# Patient Record
Sex: Female | Born: 1955 | Race: White | Hispanic: No | Marital: Single | Smoking: Never smoker
Health system: Southern US, Community
[De-identification: ages and names within clinical notes are randomized; demographics above are authoritative.]

## PROBLEM LIST (undated history)

## (undated) DIAGNOSIS — R519 Headache, unspecified: Secondary | ICD-10-CM

## (undated) DIAGNOSIS — M1990 Unspecified osteoarthritis, unspecified site: Secondary | ICD-10-CM

## (undated) DIAGNOSIS — D649 Anemia, unspecified: Secondary | ICD-10-CM

## (undated) DIAGNOSIS — R112 Nausea with vomiting, unspecified: Secondary | ICD-10-CM

## (undated) DIAGNOSIS — Z9889 Other specified postprocedural states: Secondary | ICD-10-CM

## (undated) DIAGNOSIS — G4730 Sleep apnea, unspecified: Secondary | ICD-10-CM

## (undated) DIAGNOSIS — F419 Anxiety disorder, unspecified: Secondary | ICD-10-CM

## (undated) DIAGNOSIS — J329 Chronic sinusitis, unspecified: Secondary | ICD-10-CM

## (undated) HISTORY — PX: SINUS EXPLORATION: SHX5214

## (undated) HISTORY — PX: BACK SURGERY: SHX140

## (undated) HISTORY — PX: OTHER SURGICAL HISTORY: SHX169

## (undated) HISTORY — PX: CHOLECYSTECTOMY: SHX55

## (undated) HISTORY — PX: SHOULDER ARTHROSCOPY: SHX128

---

## 1998-04-06 ENCOUNTER — Encounter: Admission: RE | Admit: 1998-04-06 | Discharge: 1998-07-05 | Payer: Self-pay | Admitting: Neurological Surgery

## 1998-07-23 ENCOUNTER — Emergency Department (HOSPITAL_COMMUNITY): Admission: EM | Admit: 1998-07-23 | Discharge: 1998-07-23 | Payer: Self-pay | Admitting: Emergency Medicine

## 1998-08-27 ENCOUNTER — Ambulatory Visit (HOSPITAL_COMMUNITY): Admission: RE | Admit: 1998-08-27 | Discharge: 1998-08-27 | Payer: Self-pay | Admitting: Neurological Surgery

## 1998-09-03 ENCOUNTER — Ambulatory Visit (HOSPITAL_COMMUNITY): Admission: RE | Admit: 1998-09-03 | Discharge: 1998-09-03 | Payer: Self-pay | Admitting: Neurological Surgery

## 1998-09-29 ENCOUNTER — Ambulatory Visit (HOSPITAL_COMMUNITY): Admission: RE | Admit: 1998-09-29 | Discharge: 1998-09-29 | Payer: Self-pay | Admitting: *Deleted

## 1998-09-29 ENCOUNTER — Encounter: Payer: Self-pay | Admitting: *Deleted

## 1998-10-24 ENCOUNTER — Other Ambulatory Visit: Admission: RE | Admit: 1998-10-24 | Discharge: 1998-10-24 | Payer: Self-pay | Admitting: Obstetrics and Gynecology

## 1998-10-31 ENCOUNTER — Ambulatory Visit (HOSPITAL_COMMUNITY): Admission: RE | Admit: 1998-10-31 | Discharge: 1998-10-31 | Payer: Self-pay | Admitting: Obstetrics and Gynecology

## 1999-02-01 ENCOUNTER — Encounter: Payer: Self-pay | Admitting: Neurological Surgery

## 1999-02-01 ENCOUNTER — Ambulatory Visit (HOSPITAL_COMMUNITY): Admission: RE | Admit: 1999-02-01 | Discharge: 1999-02-01 | Payer: Self-pay | Admitting: Neurological Surgery

## 1999-02-13 ENCOUNTER — Ambulatory Visit (HOSPITAL_COMMUNITY): Admission: RE | Admit: 1999-02-13 | Discharge: 1999-02-13 | Payer: Self-pay | Admitting: Neurological Surgery

## 1999-02-13 ENCOUNTER — Encounter: Payer: Self-pay | Admitting: Neurological Surgery

## 1999-12-28 ENCOUNTER — Other Ambulatory Visit: Admission: RE | Admit: 1999-12-28 | Discharge: 1999-12-28 | Payer: Self-pay | Admitting: Obstetrics and Gynecology

## 2000-01-10 ENCOUNTER — Ambulatory Visit (HOSPITAL_COMMUNITY): Admission: RE | Admit: 2000-01-10 | Discharge: 2000-01-10 | Payer: Self-pay | Admitting: Neurological Surgery

## 2000-01-10 ENCOUNTER — Encounter: Payer: Self-pay | Admitting: Neurological Surgery

## 2000-04-08 ENCOUNTER — Ambulatory Visit (HOSPITAL_COMMUNITY): Admission: RE | Admit: 2000-04-08 | Discharge: 2000-04-08 | Payer: Self-pay

## 2000-04-08 ENCOUNTER — Other Ambulatory Visit: Admission: RE | Admit: 2000-04-08 | Discharge: 2000-04-08 | Payer: Self-pay | Admitting: Obstetrics and Gynecology

## 2000-05-12 ENCOUNTER — Emergency Department (HOSPITAL_COMMUNITY): Admission: EM | Admit: 2000-05-12 | Discharge: 2000-05-12 | Payer: Self-pay | Admitting: *Deleted

## 2000-09-25 ENCOUNTER — Encounter: Payer: Self-pay | Admitting: *Deleted

## 2000-09-25 ENCOUNTER — Ambulatory Visit (HOSPITAL_COMMUNITY): Admission: RE | Admit: 2000-09-25 | Discharge: 2000-09-25 | Payer: Self-pay | Admitting: *Deleted

## 2000-12-04 ENCOUNTER — Encounter: Admission: RE | Admit: 2000-12-04 | Discharge: 2001-01-08 | Payer: Self-pay | Admitting: General Practice

## 2001-05-17 ENCOUNTER — Ambulatory Visit (HOSPITAL_COMMUNITY): Admission: EM | Admit: 2001-05-17 | Discharge: 2001-05-17 | Payer: Self-pay | Admitting: Emergency Medicine

## 2001-05-30 ENCOUNTER — Other Ambulatory Visit: Admission: RE | Admit: 2001-05-30 | Discharge: 2001-05-30 | Payer: Self-pay | Admitting: Obstetrics and Gynecology

## 2001-06-03 ENCOUNTER — Emergency Department (HOSPITAL_COMMUNITY): Admission: EM | Admit: 2001-06-03 | Discharge: 2001-06-03 | Payer: Self-pay | Admitting: Emergency Medicine

## 2001-07-21 ENCOUNTER — Ambulatory Visit (HOSPITAL_COMMUNITY): Admission: RE | Admit: 2001-07-21 | Discharge: 2001-07-21 | Payer: Self-pay | Admitting: Orthopaedic Surgery

## 2002-04-24 ENCOUNTER — Encounter: Admission: RE | Admit: 2002-04-24 | Discharge: 2002-06-22 | Payer: Self-pay | Admitting: General Practice

## 2002-07-09 ENCOUNTER — Other Ambulatory Visit: Admission: RE | Admit: 2002-07-09 | Discharge: 2002-07-09 | Payer: Self-pay | Admitting: Obstetrics and Gynecology

## 2002-11-06 ENCOUNTER — Ambulatory Visit (HOSPITAL_COMMUNITY): Admission: RE | Admit: 2002-11-06 | Discharge: 2002-11-06 | Payer: Self-pay | Admitting: Obstetrics and Gynecology

## 2002-11-06 ENCOUNTER — Encounter: Payer: Self-pay | Admitting: Obstetrics and Gynecology

## 2004-01-28 ENCOUNTER — Other Ambulatory Visit: Admission: RE | Admit: 2004-01-28 | Discharge: 2004-01-28 | Payer: Self-pay | Admitting: Obstetrics and Gynecology

## 2004-03-13 ENCOUNTER — Emergency Department (HOSPITAL_COMMUNITY): Admission: EM | Admit: 2004-03-13 | Discharge: 2004-03-14 | Payer: Self-pay | Admitting: Emergency Medicine

## 2004-04-18 ENCOUNTER — Ambulatory Visit (HOSPITAL_COMMUNITY): Admission: RE | Admit: 2004-04-18 | Discharge: 2004-04-18 | Payer: Self-pay | Admitting: Obstetrics and Gynecology

## 2006-04-17 ENCOUNTER — Encounter: Payer: Self-pay | Admitting: Neurological Surgery

## 2008-01-28 ENCOUNTER — Emergency Department (HOSPITAL_COMMUNITY): Admission: EM | Admit: 2008-01-28 | Discharge: 2008-01-28 | Payer: Self-pay | Admitting: Emergency Medicine

## 2008-05-10 ENCOUNTER — Emergency Department (HOSPITAL_COMMUNITY): Admission: EM | Admit: 2008-05-10 | Discharge: 2008-05-10 | Payer: Self-pay | Admitting: Emergency Medicine

## 2008-05-26 ENCOUNTER — Emergency Department (HOSPITAL_COMMUNITY): Admission: EM | Admit: 2008-05-26 | Discharge: 2008-05-26 | Payer: Self-pay | Admitting: Emergency Medicine

## 2008-06-04 ENCOUNTER — Emergency Department (HOSPITAL_COMMUNITY): Admission: EM | Admit: 2008-06-04 | Discharge: 2008-06-04 | Payer: Self-pay | Admitting: Emergency Medicine

## 2008-08-17 ENCOUNTER — Encounter: Admission: RE | Admit: 2008-08-17 | Discharge: 2008-08-17 | Payer: Self-pay | Admitting: Obstetrics and Gynecology

## 2009-04-01 ENCOUNTER — Emergency Department (HOSPITAL_COMMUNITY): Admission: EM | Admit: 2009-04-01 | Discharge: 2009-04-01 | Payer: Self-pay | Admitting: Emergency Medicine

## 2011-05-04 NOTE — Op Note (Signed)
[facility]  Patient:    Emily Brock, Emily Brock                         MRN: [id number] Proc. Date: 05/17/01 Attending:  Everardo All. Madilyn Fireman, M.D. CC:         Florencia Reasons, M.D.   Operative Report  PROCEDURE:  Esophagogastroduodenoscopy with foreign body removal.  ENDOSCOPIST:  Everardo All. Madilyn Fireman, M.D.  INDICATIONS FOR PROCEDURE:  Sensation of esophageal obstruction after eating a piece of rib eye steak.  DESCRIPTION OF PROCEDURE:   The patient was placed in the left lateral decubitus position and placed on the pulse monitor with continuous low-flow oxygen delivered by nasal cannula.  She was sedated with 100 mg IV Demerol and 10 mg IV Versed.  The Olympus video endoscope was advanced under direct vision into the oropharynx and esophagus.  The esophagus was straight and of normal caliber with a fair amount of saliva pooled in the esophagus.  The scope was advanced to the distal esophagus where there was a food bolus occupying the majority of the lumen.  With gentle pressure, it passed into the stomach where there was also some other solid food present.  Detailed examination of the stomach was not performed.  Retroflexed view of the cardia was unremarkable. The distal esophagus upon withdrawal into the body did appear somewhat inflamed, and there was some slight oozing of blood as if from a small tear. Therefore, I did not perform dilatation, and it was somewhat difficult to assess overall length of stricture versus ring due to the surrounding inflammation and blood.  The scope was then withdrawn, and the patient returned to the recovery room in stable condition.  She tolerated the procedure well, and there were no immediate complications.  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  PLAN: 1. Chew food carefully. 2. Trial of Prilosec. 3. Follow up with Dr. Matthias Hughs for elective dilatation. DD:  05/17/01 TD:   05/18/01 Job: [id number]

## 2011-05-04 NOTE — Op Note (Signed)
[place]  Patient:    Emily Brock, Emily Brock Visit Number: [id number] MRN: [id number]          Service Type: SUR Location: [facility] 2899 19 Attending Physician:  Jacki Cones Proc. Date: 08/08/01 Adm. Date:  07/21/2001 Disc. Date: 07/21/2001                             Operative Report  PREOPERATIVE DIAGNOSIS:  Right dorsal foot mass.  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  PROCEDURE:  Right foot exploration, dorsalis pedis artery mobilization and superficial perineal nerve neurolysis.  SURGEON:  Mark C. Ophelia Charter, M.D.  TOURNIQUET TIME:  41 minutes.  BRIEF HISTORY:  A 55 year old female with painful mass with shoe wearing and with activities.  MRI scan showed apparent ganglion of the dorsum of the foot adjacent to the neurovascular structures.  This was felt to most likely be a ganglion from either the ankle joint or talonavicular joint with superficial perineal nerve compression.  The patient was admitted for a planned ganglionic excision due to her persistent pain.  After the induction of preoperative anesthesia with tourniquet application, standard prepping, preoperative Ancef prophylaxis, usual draping and sterile glove placed over the toes, a skin marker was used, an incision was made longitudinally, directly over the mass.  Neurovascular bundle was identified after splitting the retinaculum and there was large aneurysmal dilatation of the dorsalis pedis artery which was tortuous.  The patient denied any previous history of trauma either recently or remotely.  Superficial perineal nerve was adherent to the tortuous mass as the branches came across extending toward the second and third toes.  Neurolysis was performed mobilizing the nerve, and the artery was taken laterally and branches of the nerve mobilized medially. There was no abnormalities to the  accompanying vena communicantes.  Exploration due to this showed no abnormalities of the ankle joint, no ganglion present.  Extensor tendons were normal.  Common extensors, EHL, were carefully inspected, pulled on, identified and there was no ganglion present.  It was apparent from visualization that the tortuous aorta was responsible for the findings on the MRI scan and after irrigation.  Subcutaneous tissue was reapproximated with Vicryl suture, and simple interrupted nylon suture. Postoperative soft dressing was applied, and tourniquet time was 41 minutes.  The patient was transferred to recovery room and then discharged home as an outpatient procedure. Attending Physician:  Jacki Cones DD:  08/08/01 TD:  08/10/01 Job: [id number]

## 2011-05-04 NOTE — Discharge Summary (Signed)
[place]  Patient:    Emily Brock, Emily Brock Visit Number: [id number] MRN: [id number]          Service Type: SUR Location: [place] 2899 19 Attending Physician:  Jacki Cones Dictated by:   Veverly Fells Ophelia Charter, M.D. Adm. Date:  07/21/2001 Disc. 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  PROCEDURE:  Right foot exploration, neurolysis of superficial peroneal nerve, and dorsalis pedis artery mobilization that was on July 21, 2001.  HISTORY OF PRESENT ILLNESS:  A 55 year old female who has had chronic pain in her foot for several years.  She has no history of trauma.  ADMISSION MEDICATIONS: 1. Ambien at night. 2. Valium 5 mg at night. 3. Hydrocodone p.r.n.  ALLERGIES: 1. TETRACYCLINE. 2. LATEX.  HOSPITAL COURSE:  The patient was admitted after informed consent.  She had had an MRI preoperatively, which had the appearance of a ganglion-type structure adjacent to the dorsalis pedis artery.  The mass was tender and gave her pain radiating into her toes.  Intraoperative exploration revealed that she had a dilated aneurysmal dilatation of the dorsalis pedis artery, which is most likely post-traumatic but the patient did not ever remember history of trauma, either acutely or remotely.  Superficial peroneal nerve had a neuroma in continuity, which was mobilized away from the artery.  Artery was mobilized as well.  Tourniquet time was 41 minutes and postoperatively the patient was ambulatory on crutches and was discharged. Dictated by:   Veverly Fells Ophelia Charter, M.D. Attending Physician:  Jacki Cones DD:  08/08/01 TD:  08/10/01 Job: [id number]

## 2011-11-12 ENCOUNTER — Encounter: Payer: Self-pay | Admitting: Emergency Medicine

## 2011-11-12 ENCOUNTER — Emergency Department (HOSPITAL_COMMUNITY)
Admission: EM | Admit: 2011-11-12 | Discharge: 2011-11-12 | Disposition: A | Discharge status: Home / Self Care | Payer: Medicaid - Out of State | Attending: Emergency Medicine | Admitting: Emergency Medicine

## 2011-11-12 DIAGNOSIS — Z79899 Other long term (current) drug therapy: Secondary | ICD-10-CM | POA: Insufficient documentation

## 2011-11-12 DIAGNOSIS — R51 Headache: Secondary | ICD-10-CM | POA: Insufficient documentation

## 2011-11-12 DIAGNOSIS — J329 Chronic sinusitis, unspecified: Secondary | ICD-10-CM

## 2011-11-12 DIAGNOSIS — J45909 Unspecified asthma, uncomplicated: Secondary | ICD-10-CM | POA: Insufficient documentation

## 2011-11-12 MED ORDER — AMOXICILLIN-POT CLAVULANATE 875-125 MG PO TABS: 1.0000 | ORAL_TABLET | Freq: Two times a day (BID) | ORAL | Status: AC

## 2011-11-12 NOTE — ED Notes (Signed)
Just moved back to [place] and had a h/a and now having sinus pain  Since last week

## 2011-11-12 NOTE — ED Provider Notes (Signed)
History     CSN: [id number] Arrival date & time: 11/12/2011  1:31 PM   First MD Initiated Contact with Patient 11/12/11 1505      Chief Complaint  Patient presents with  . Facial Pain     HPI  Onset - several days ago Course - worsening Associated symptoms - facial pain, mild headache, nasal congestion Worsened by - nothing Improved by - nothing  Pt reports she is having  sinus pain ,similar to prior sinus infections.  Report she always needs abx No visual changes   Past Medical History  Diagnosis Date  . Asthma     Past Surgical History  Procedure Date  . Back surgery   . Cholecystectomy   . Sinus exploration   . Shoulder arthroscopy     No family history on file.  History  Substance Use Topics  . Smoking status: Not on file  . Smokeless tobacco: Not on file  . Alcohol Use: No    OB History    Grav Para Term Preterm Abortions TAB SAB Ect Mult Living                  Review of Systems  Constitutional: Negative for fever.  Eyes: Negative for visual disturbance.    Allergies  Ciprofloxacin  Home Medications   Current Outpatient Rx  Name Route Sig Dispense Refill  . ALBUTEROL SULFATE HFA 108 (90 BASE) MCG/ACT IN AERS Inhalation Inhale 2 puffs into the lungs every 6 (six) hours as needed.      Marland Kitchen CALCIUM + D PO Oral Take 1 tablet by mouth daily.      Marland Kitchen FLUTICASONE PROPIONATE (INHAL) 100 MCG/BLIST IN AEPB Inhalation Inhale 1 puff into the lungs 2 (two) times daily.      Marland Kitchen HYDROCODONE-ACETAMINOPHEN 5-500 MG PO CAPS Oral Take 1 capsule by mouth 2 (two) times daily.      Marland Kitchen ZOLPIDEM TARTRATE 10 MG PO TABS Oral Take 10 mg by mouth at bedtime as needed.      . AMOXICILLIN-POT CLAVULANATE 875-125 MG PO TABS Oral Take 1 tablet by mouth every 12 (twelve) hours. 20 tablet 0    BP 118/77  Pulse 92  Temp(Src) 98 F (36.7 C) (Oral)  Resp 16  SpO2 100%  Physical Exam  CONSTITUTIONAL: Well developed/well nourished HEAD AND FACE:  Normocephalic/atraumatic EYES: EOMI/PERRL ENMT: Mucous membranes moist, nasal congestion, no drainage noted, no facial erythema/edema NECK: supple no meningeal signs CV: S1/S2 noted, no murmurs/rubs/gallops noted LUNGS: Lungs are clear to auscultation bilaterally, no apparent distress ABDOMEN: soft, nontender, no rebound or guarding NEURO: Pt is awake/alert, moves all extremitiesx4 EXTREMITIES: pulses normal, full ROM SKIN: warm, color normal PSYCH: no abnormalities of mood noted   ED Course  Procedures (including critical care time)  Labs Reviewed - No data to display No results found.   1. Sinusitis       MDM  Nursing notes reviewed and considered in documentation  Pt requesting abx.  At this point, I recommended nasal spray, OTC meds as likely viral.  She insists on abx.  I wrote for augmentin (she requested this) but I asked her to try OTC meds first.  She will f/u with ENT        Joya Gaskins, MD 11/12/11 1531

## 2011-12-18 HISTORY — PX: OTHER SURGICAL HISTORY: SHX169

## 2012-01-17 ENCOUNTER — Other Ambulatory Visit: Payer: Self-pay | Admitting: Internal Medicine

## 2012-01-17 DIAGNOSIS — Z1231 Encounter for screening mammogram for malignant neoplasm of breast: Secondary | ICD-10-CM

## 2012-01-29 ENCOUNTER — Ambulatory Visit
Admission: RE | Admit: 2012-01-29 | Discharge: 2012-01-29 | Disposition: A | Discharge status: Home / Self Care | Payer: Medicaid Other | Source: Ambulatory Visit | Attending: Internal Medicine | Admitting: Internal Medicine

## 2012-01-29 DIAGNOSIS — Z1231 Encounter for screening mammogram for malignant neoplasm of breast: Secondary | ICD-10-CM

## 2012-06-18 ENCOUNTER — Encounter (HOSPITAL_COMMUNITY): Payer: Self-pay | Admitting: Emergency Medicine

## 2012-06-18 ENCOUNTER — Emergency Department (HOSPITAL_COMMUNITY)
Admission: EM | Admit: 2012-06-18 | Discharge: 2012-06-18 | Disposition: A | Discharge status: Home / Self Care | Payer: Medicaid Other | Attending: Emergency Medicine | Admitting: Emergency Medicine

## 2012-06-18 DIAGNOSIS — Z79899 Other long term (current) drug therapy: Secondary | ICD-10-CM | POA: Insufficient documentation

## 2012-06-18 DIAGNOSIS — J329 Chronic sinusitis, unspecified: Secondary | ICD-10-CM

## 2012-06-18 DIAGNOSIS — J45909 Unspecified asthma, uncomplicated: Secondary | ICD-10-CM | POA: Insufficient documentation

## 2012-06-18 HISTORY — DX: Chronic sinusitis, unspecified: J32.9

## 2012-06-18 MED ORDER — AMOXICILLIN-POT CLAVULANATE 875-125 MG PO TABS: 1.0000 | ORAL_TABLET | Freq: Two times a day (BID) | ORAL | Status: AC

## 2012-06-18 NOTE — ED Notes (Signed)
Thick mucus, yellowish. Painful to flush nose.

## 2012-06-18 NOTE — ED Provider Notes (Signed)
Medical screening examination/treatment/procedure(s) were performed by non-physician practitioner and as supervising physician I was immediately available for consultation/collaboration.   Suzi Roots, MD 06/18/12 1539

## 2012-06-18 NOTE — ED Notes (Signed)
Family at bedside. 

## 2012-06-18 NOTE — ED Notes (Signed)
MD at bedside. 

## 2012-06-18 NOTE — ED Notes (Signed)
Pt complaints of fatigue and headache. "sick feeling all over". Condition is chronic for pt and she has sinus infections very often. Pt will have an appointment with Dr. Verne Spurr in [facility].  No n/v/d. Pt complaints of chills.

## 2012-06-18 NOTE — ED Provider Notes (Signed)
History     CSN: [id number]  Arrival date & time 06/18/12  1105   None     Chief Complaint  Patient presents with  . Recurrent Sinusitis    (Consider location/radiation/quality/duration/timing/severity/associated sxs/prior treatment) HPI  Onset - several days ago  Course - worsening  Associated symptoms - facial pain, mild headache, nasal congestion  Worsened by - nothing  Improved by - nothing  Pt reports she is having sinus pain ,similar to prior sinus infections. Report she always needs abx  No visual changes   Past Medical History  Diagnosis Date  . Asthma   . Recurrent sinus infections     Past Surgical History  Procedure Date  . Back surgery   . Cholecystectomy   . Sinus exploration   . Shoulder arthroscopy   . Thoracic scapular infusion 2013  . Right foot surgery     No family history on file.  History  Substance Use Topics  . Smoking status: Never Smoker   . Smokeless tobacco: Not on file  . Alcohol Use: No    OB History    Grav Para Term Preterm Abortions TAB SAB Ect Mult Living                  Review of Systems   HEENT: denies blurry vision or change in hearing PULMONARY: Denies difficulty breathing and SOB CARDIAC: denies chest pain or heart palpitations MUSCULOSKELETAL:  denies being unable to ambulate ABDOMEN AL: denies abdominal pain GU: denies loss of bowel or urinary control NEURO: denies numbness and tingling in extremities      Allergies  Ciprofloxacin  Home Medications   Current Outpatient Rx  Name Route Sig Dispense Refill  . ALBUTEROL SULFATE HFA 108 (90 BASE) MCG/ACT IN AERS Inhalation Inhale 2 puffs into the lungs every 6 (six) hours as needed.      . AMOXICILLIN-POT CLAVULANATE 875-125 MG PO TABS Oral Take 1 tablet by mouth every 12 (twelve) hours. 42 tablet 0  . CALCIUM + D PO Oral Take 1 tablet by mouth daily.      Marland Kitchen FLUTICASONE PROPIONATE (INHAL) 100 MCG/BLIST IN AEPB Inhalation Inhale 1 puff into the lungs 2  (two) times daily.      Marland Kitchen HYDROCODONE-ACETAMINOPHEN 5-500 MG PO CAPS Oral Take 1 capsule by mouth 2 (two) times daily.      Marland Kitchen ZOLPIDEM TARTRATE 10 MG PO TABS Oral Take 10 mg by mouth at bedtime as needed.        BP 105/74  Pulse 112  Temp 98 F (36.7 C) (Oral)  Resp 18  Ht 5\' 3"  (1.6 m)  Wt 120 lb (54.432 kg)  BMI 21.26 kg/m2  SpO2 100%  Physical Exam  Nursing note and vitals reviewed. Constitutional: She appears well-developed and well-nourished. No distress.  HENT:  Head: Normocephalic and atraumatic.  Nose: Mucosal edema, rhinorrhea and sinus tenderness present. No nose lacerations, nasal deformity, septal deviation or nasal septal hematoma. No epistaxis.  No foreign bodies. Right sinus exhibits frontal sinus tenderness. Left sinus exhibits frontal sinus tenderness.  Eyes: Pupils are equal, round, and reactive to light.  Neck: Normal range of motion. Neck supple.  Cardiovascular: Normal rate and regular rhythm.   Pulmonary/Chest: Effort normal.  Abdominal: Soft.  Neurological: She is alert.  Skin: Skin is warm and dry.    ED Course  Procedures (including critical care time)  Labs Reviewed - No data to display No results found.   1. Sinusitis  MDM   Pt requesting abx. At this point, I recommended nasal spray, OTC meds as likely viral. She insists on abx. I wrote for augmentin (she requested this) but I asked her to try OTC meds first. She will f/u with ENT in [facility]  Pt has been advised of the symptoms that warrant their return to the ED. Patient has voiced understanding and has agreed to follow-up with the PCP or specialist.         Dorthula Matas, PA 06/18/12 1137

## 2013-03-17 ENCOUNTER — Other Ambulatory Visit: Payer: Self-pay | Admitting: Internal Medicine

## 2013-03-17 ENCOUNTER — Other Ambulatory Visit: Payer: Self-pay

## 2013-03-17 DIAGNOSIS — Z1231 Encounter for screening mammogram for malignant neoplasm of breast: Secondary | ICD-10-CM

## 2013-04-01 ENCOUNTER — Ambulatory Visit
Admission: RE | Admit: 2013-04-01 | Discharge: 2013-04-01 | Disposition: A | Discharge status: Home / Self Care | Payer: Medicaid Other | Source: Ambulatory Visit

## 2013-04-01 DIAGNOSIS — Z1231 Encounter for screening mammogram for malignant neoplasm of breast: Secondary | ICD-10-CM

## 2013-04-08 ENCOUNTER — Ambulatory Visit: Payer: Medicaid Other

## 2013-09-21 ENCOUNTER — Other Ambulatory Visit: Payer: Self-pay | Admitting: *Deleted

## 2013-09-21 DIAGNOSIS — M25511 Pain in right shoulder: Secondary | ICD-10-CM

## 2013-09-25 ENCOUNTER — Ambulatory Visit
Admission: RE | Admit: 2013-09-25 | Discharge: 2013-09-25 | Disposition: A | Discharge status: Home / Self Care | Payer: Medicaid Other | Source: Ambulatory Visit | Attending: *Deleted | Admitting: *Deleted

## 2013-09-25 DIAGNOSIS — M25511 Pain in right shoulder: Secondary | ICD-10-CM

## 2013-12-01 ENCOUNTER — Ambulatory Visit: Payer: Medicaid Other | Attending: Orthopedic Surgery | Admitting: Physical Therapy

## 2013-12-01 DIAGNOSIS — M25519 Pain in unspecified shoulder: Secondary | ICD-10-CM | POA: Insufficient documentation

## 2013-12-01 DIAGNOSIS — M25619 Stiffness of unspecified shoulder, not elsewhere classified: Secondary | ICD-10-CM | POA: Insufficient documentation

## 2013-12-01 DIAGNOSIS — IMO0001 Reserved for inherently not codable concepts without codable children: Secondary | ICD-10-CM | POA: Insufficient documentation

## 2013-12-01 DIAGNOSIS — M7989 Other specified soft tissue disorders: Secondary | ICD-10-CM | POA: Insufficient documentation

## 2013-12-07 ENCOUNTER — Ambulatory Visit: Payer: Medicaid Other | Admitting: Physical Therapy

## 2013-12-14 ENCOUNTER — Ambulatory Visit: Payer: Medicaid Other | Admitting: Physical Therapy

## 2013-12-16 ENCOUNTER — Ambulatory Visit: Payer: Medicaid Other | Admitting: Physical Therapy

## 2014-01-05 ENCOUNTER — Ambulatory Visit: Payer: Medicaid Other | Admitting: Physical Therapy

## 2014-06-30 ENCOUNTER — Encounter (HOSPITAL_COMMUNITY): Payer: Self-pay | Admitting: Emergency Medicine

## 2014-06-30 ENCOUNTER — Emergency Department (HOSPITAL_COMMUNITY)
Admission: EM | Admit: 2014-06-30 | Discharge: 2014-06-30 | Disposition: A | Discharge status: Home / Self Care | Payer: Medicaid Other | Attending: Emergency Medicine | Admitting: Emergency Medicine

## 2014-06-30 DIAGNOSIS — J45909 Unspecified asthma, uncomplicated: Secondary | ICD-10-CM | POA: Insufficient documentation

## 2014-06-30 DIAGNOSIS — IMO0002 Reserved for concepts with insufficient information to code with codable children: Secondary | ICD-10-CM | POA: Insufficient documentation

## 2014-06-30 DIAGNOSIS — J320 Chronic maxillary sinusitis: Secondary | ICD-10-CM

## 2014-06-30 DIAGNOSIS — Z79899 Other long term (current) drug therapy: Secondary | ICD-10-CM | POA: Insufficient documentation

## 2014-06-30 DIAGNOSIS — J3489 Other specified disorders of nose and nasal sinuses: Secondary | ICD-10-CM | POA: Diagnosis present

## 2014-06-30 MED ORDER — AMOXICILLIN-POT CLAVULANATE 500-125 MG PO TABS: 1.0000 | ORAL_TABLET | Freq: Three times a day (TID) | ORAL | Status: DC

## 2014-06-30 NOTE — ED Notes (Signed)
Pt states Monday started not feeling well, states has a sinus infection, states PCP can't see her until end of week, Pt states having facial pressure d/t not draining.

## 2014-06-30 NOTE — Discharge Instructions (Signed)
Sinusitis Sinusitis is redness, soreness, and swelling (inflammation) of the paranasal sinuses. Paranasal sinuses are air pockets within the bones of your face (beneath the eyes, the middle of the forehead, or above the eyes). In healthy paranasal sinuses, mucus is able to drain out, and air is able to circulate through them by way of your nose. However, when your paranasal sinuses are inflamed, mucus and air can become trapped. This can allow bacteria and other germs to grow and cause infection. Sinusitis can develop quickly and last only a short time (acute) or continue over a long period (chronic). Sinusitis that lasts for more than 12 weeks is considered chronic.  CAUSES  Causes of sinusitis include:  Allergies.  Structural abnormalities, such as displacement of the cartilage that separates your nostrils (deviated septum), which can decrease the air flow through your nose and sinuses and affect sinus drainage.  Functional abnormalities, such as when the small hairs (cilia) that line your sinuses and help remove mucus do not work properly or are not present. SYMPTOMS  Symptoms of acute and chronic sinusitis are the same. The primary symptoms are pain and pressure around the affected sinuses. Other symptoms include:  Upper toothache.  Earache.  Headache.  Bad breath.  Decreased sense of smell and taste.  A cough, which worsens when you are lying flat.  Fatigue.  Fever.  Thick drainage from your nose, which often is green and may contain pus (purulent).  Swelling and warmth over the affected sinuses. DIAGNOSIS  Your caregiver will perform a physical exam. During the exam, your caregiver may:  Look in your nose for signs of abnormal growths in your nostrils (nasal polyps).  Tap over the affected sinus to check for signs of infection.  View the inside of your sinuses (endoscopy) with a special imaging device with a light attached (endoscope), which is inserted into your  sinuses. If your caregiver suspects that you have chronic sinusitis, one or more of the following tests may be recommended:  Allergy tests.  Nasal culture--A sample of mucus is taken from your nose and sent to a lab and screened for bacteria.  Nasal cytology--A sample of mucus is taken from your nose and examined by your caregiver to determine if your sinusitis is related to an allergy. TREATMENT  Most cases of acute sinusitis are related to a viral infection and will resolve on their own within 10 days. Sometimes medicines are prescribed to help relieve symptoms (pain medicine, decongestants, nasal steroid sprays, or saline sprays).  However, for sinusitis related to a bacterial infection, your caregiver will prescribe antibiotic medicines. These are medicines that will help kill the bacteria causing the infection.  Rarely, sinusitis is caused by a fungal infection. In theses cases, your caregiver will prescribe antifungal medicine. For some cases of chronic sinusitis, surgery is needed. Generally, these are cases in which sinusitis recurs more than 3 times per year, despite other treatments. HOME CARE INSTRUCTIONS   Drink plenty of water. Water helps thin the mucus so your sinuses can drain more easily.  Use a humidifier.  Inhale steam 3 to 4 times a day (for example, sit in the bathroom with the shower running).  Apply a warm, moist washcloth to your face 3 to 4 times a day, or as directed by your caregiver.  Use saline nasal sprays to help moisten and clean your sinuses.  Take over-the-counter or prescription medicines for pain, discomfort, or fever only as directed by your caregiver. SEEK IMMEDIATE MEDICAL CARE IF:    You have increasing pain or severe headaches.  You have nausea, vomiting, or drowsiness.  You have swelling around your face.  You have vision problems.  You have a stiff neck.  You have difficulty breathing. MAKE SURE YOU:   Understand these  instructions.  Will watch your condition.  Will get help right away if you are not doing well or get worse. Document Released: 12/03/2005 Document Revised: 02/25/2012 Document Reviewed: 12/18/2011 Exit[employer] Patient Information 2015 [employer]. This information is not intended to replace advice given to you by your health care provider. Make sure you discuss any questions you have with your health care provider.  

## 2014-06-30 NOTE — ED Provider Notes (Signed)
CSN: [id number]     Arrival date & time 06/30/14  1318 History  This chart was scribed for non-physician practitioner, Eben Burow, PA-C, working with Linwood Dibbles, MD by Charline Bills, ED Scribe. This patient was seen in room WTR7/WTR7 and the patient's care was started at 1:46 PM.   Chief Complaint  Patient presents with  . Recurrent Sinusitis   The history is provided by the patient. No language interpreter was used.   HPI Comments: Emily Brock is a 58 y.o. female who presents to the Emergency Department complaining of gradually worsening nasal congestion onset 2 days ago. Pt reports associated subjective fever, chills, sinus pressure. She reports yellow mucous that she describes as sticky. Pt denies cough, SOB, ear pain, sore throat. She reports recurrent sinus infections; last treated for similar symptoms in May. Pt was not able to be seen by her PCP for another 2 days or her ENT, Dr. Verne Spurr, for a few more weeks. Pt has tried Tylenol, Flonase and steam with mild relief.   Past Medical History  Diagnosis Date  . Asthma   . Recurrent sinus infections    Past Surgical History  Procedure Laterality Date  . Back surgery    . Cholecystectomy    . Sinus exploration    . Shoulder arthroscopy    . Thoracic scapular infusion  2013  . Right foot surgery     No family history on file. History  Substance Use Topics  . Smoking status: Never Smoker   . Smokeless tobacco: Not on file  . Alcohol Use: No   OB History   Grav Para Term Preterm Abortions TAB SAB Ect Mult Living                 Review of Systems  Constitutional: Positive for fever (subjective) and chills.  HENT: Positive for congestion and sinus pressure. Negative for ear pain and sore throat.   Respiratory: Negative for cough and shortness of breath.   All other systems reviewed and are negative.  Allergies  Ciprofloxacin  Home Medications   Prior to Admission medications   Medication Sig Start Date End  Date Taking? Authorizing Provider  albuterol (PROVENTIL HFA;VENTOLIN HFA) 108 (90 BASE) MCG/ACT inhaler Inhale 2 puffs into the lungs every 6 (six) hours as needed.      Historical Provider, MD  amoxicillin-clavulanate (AUGMENTIN) 500-125 MG per tablet Take 1 tablet (500 mg total) by mouth 3 (three) times daily. 06/30/14    A Forcucci, PA-C  Calcium Carbonate-Vitamin D (CALCIUM + D PO) Take 1 tablet by mouth daily.      Historical Provider, MD  Fluticasone Propionate, Inhal, (FLOVENT DISKUS) 100 MCG/BLIST AEPB Inhale 1 puff into the lungs 2 (two) times daily.      Historical Provider, MD  hydrocodone-acetaminophen (LORCET-HD) 5-500 MG per capsule Take 1 capsule by mouth 2 (two) times daily.      Historical Provider, MD  zolpidem (AMBIEN) 10 MG tablet Take 10 mg by mouth at bedtime as needed.      Historical Provider, MD   Triage Vitals: BP 120/69  Pulse 110  Temp(Src) 98.1 F (36.7 C) (Oral)  Resp 16  Ht 5\' 3"  (1.6 m)  Wt 116 lb (52.617 kg)  BMI 20.55 kg/m2  SpO2 100% Physical Exam  Nursing note and vitals reviewed. Constitutional: She is oriented to person, place, and time. She appears well-developed and well-nourished. No distress.  HENT:  Head: Normocephalic and atraumatic.  Right Ear: Hearing, tympanic  membrane, external ear and ear canal normal.  Left Ear: Hearing, tympanic membrane, external ear and ear canal normal.  Nose: Mucosal edema present. Right sinus exhibits maxillary sinus tenderness. Left sinus exhibits maxillary sinus tenderness.  Mouth/Throat: Oropharynx is clear and moist. No oropharyngeal exudate.  Eyes: Conjunctivae are normal. No scleral icterus.  Neck: Normal range of motion. Neck supple. No JVD present. No thyromegaly present.  Cardiovascular: Normal rate, regular rhythm, normal heart sounds and intact distal pulses.  Exam reveals no gallop and no friction rub.   No murmur heard. Pulmonary/Chest: Effort normal and breath sounds normal. No respiratory  distress. She has no wheezes. She has no rales. She exhibits no tenderness.  Lymphadenopathy:    She has no cervical adenopathy.  Neurological: She is alert and oriented to person, place, and time.  Skin: Skin is warm and dry. She is not diaphoretic.  Psychiatric: She has a normal mood and affect. Her behavior is normal. Judgment and thought content normal.    ED Course  Procedures (including critical care time) DIAGNOSTIC STUDIES: Oxygen Saturation is 100% on RA, normal by my interpretation.    COORDINATION OF CARE: 2:01 PM-Discussed treatment plan which includes antibiotics and return precautions with pt at bedside and pt agreed to plan.   Labs Review Labs Reviewed - No data to display  Imaging Review No results found.   EKG Interpretation None      MDM   Final diagnoses:  Chronic maxillary sinusitis   Suspect that this is likely a viral sinusitis at this time given afebrile status and also symptoms of just a couple days.  I have encouraged the patient to use nasal saline and to use tylenol or ibuprofen at this time.  Patient insists on have a prescription for augmentin a this time.  I have given augmentin prescription but have warned her that this will likely not help her symptoms.  She was encouraged to follow-up with her PCP or her ENT.  She states understanding   I personally performed the services described in this documentation, which was scribed in my presence. The recorded information has been reviewed and is accurate.    Eben Burowourtney A Forcucci, PA-C 06/30/14 2102

## 2014-07-01 NOTE — ED Provider Notes (Signed)
Medical screening examination/treatment/procedure(s) were performed by non-physician practitioner and as supervising physician I was immediately available for consultation/collaboration.    , MD 07/01/14 0706 

## 2015-03-31 ENCOUNTER — Other Ambulatory Visit: Payer: Self-pay | Admitting: Internal Medicine

## 2015-03-31 DIAGNOSIS — M810 Age-related osteoporosis without current pathological fracture: Secondary | ICD-10-CM

## 2015-03-31 DIAGNOSIS — E2839 Other primary ovarian failure: Secondary | ICD-10-CM

## 2015-04-04 ENCOUNTER — Other Ambulatory Visit: Payer: Self-pay

## 2015-04-04 DIAGNOSIS — Z1231 Encounter for screening mammogram for malignant neoplasm of breast: Secondary | ICD-10-CM

## 2015-04-14 ENCOUNTER — Ambulatory Visit: Payer: Medicaid Other

## 2015-04-14 ENCOUNTER — Other Ambulatory Visit: Payer: Medicaid Other

## 2015-07-22 ENCOUNTER — Ambulatory Visit
Admission: RE | Admit: 2015-07-22 | Discharge: 2015-07-22 | Disposition: A | Discharge status: Home / Self Care | Payer: Medicaid Other | Source: Ambulatory Visit | Attending: Internal Medicine | Admitting: Internal Medicine

## 2015-07-22 ENCOUNTER — Ambulatory Visit
Admission: RE | Admit: 2015-07-22 | Discharge: 2015-07-22 | Disposition: A | Discharge status: Home / Self Care | Payer: Medicaid Other | Source: Ambulatory Visit

## 2015-07-22 DIAGNOSIS — E2839 Other primary ovarian failure: Secondary | ICD-10-CM

## 2015-07-22 DIAGNOSIS — Z1231 Encounter for screening mammogram for malignant neoplasm of breast: Secondary | ICD-10-CM

## 2015-07-22 DIAGNOSIS — M810 Age-related osteoporosis without current pathological fracture: Secondary | ICD-10-CM

## 2015-08-24 ENCOUNTER — Ambulatory Visit (HOSPITAL_BASED_OUTPATIENT_CLINIC_OR_DEPARTMENT_OTHER): Payer: Medicaid Other | Attending: Otolaryngology | Admitting: Radiology

## 2015-08-24 VITALS — Ht 63.0 in | Wt 118.0 lb

## 2015-08-24 DIAGNOSIS — G4710 Hypersomnia, unspecified: Secondary | ICD-10-CM | POA: Diagnosis present

## 2015-08-24 DIAGNOSIS — R400 Somnolence: Secondary | ICD-10-CM

## 2015-08-24 DIAGNOSIS — G4733 Obstructive sleep apnea (adult) (pediatric): Secondary | ICD-10-CM | POA: Insufficient documentation

## 2015-08-24 DIAGNOSIS — R0683 Snoring: Secondary | ICD-10-CM | POA: Diagnosis not present

## 2015-09-03 ENCOUNTER — Encounter (HOSPITAL_BASED_OUTPATIENT_CLINIC_OR_DEPARTMENT_OTHER): Payer: Medicaid Other | Admitting: Internal Medicine

## 2015-09-03 DIAGNOSIS — R400 Somnolence: Secondary | ICD-10-CM

## 2015-09-03 NOTE — Progress Notes (Signed)
    Patient Name: Emily Brock, Emily Brock Date: 08/24/2015 Gender: Female D.O.B: January 16, 1956 Age (years): 58 Referring Provider: Christia Reading Height (inches): 63 Interpreting Physician: Jetty Duhamel MD, ABSM Weight (lbs): 118 RPSGT: [place] Sink BMI: 21 MRN: [id number] Neck Size: 14.00 CLINICAL INFORMATION Sleep Study Type:Unattended Home Sleep Test     Indication for sleep study: 780.54 Hypersomnia, Fatigue, Hypersomnia, Hypersomnia (780.54), Snoring, Witnessed Apneas   Epworth Sleepiness Score: 0/24  SLEEP STUDY TECHNIQUE A multi-channel overnight portable sleep study was performed. The channels recorded were: nasal airflow, thoracic respiratory movement, and oxygen saturation with a pulse oximetry. Snoring was also monitored.  MEDICATIONS Patient self administered medications include: Charted for review.  SLEEP ARCHITECTURE Patient was studied for 313.2 minutes. The sleep efficiency was 99.8 % and the patient was supine for 51.5%. The arousal index was 0.0 per hour.  RESPIRATORY PARAMETERS The overall AHI was 29.5 per hour, with a central apnea index of 4.2 per hour.  The oxygen nadir was 77% during sleep.  CARDIAC DATA Mean heart rate during sleep was 90.6 bpm.  IMPRESSIONS Moderate obstructive sleep apnea occurred during this study (AHI = 29.5/h). No significant central sleep apnea occurred during this study (CAI = 4.2/h). Severe oxygen desaturation was noted during this study (Min O2 = 77%). Mean O2 saturation during sleep on room air was 95% Patient snored 15.6% of time during  Sleep.  DIAGNOSIS Obstructive Sleep Apnea (327.23 [G47.33 ICD-10])  RECOMMENDATIONS Avoid alcohol, sedatives and other CNS depressants that may worsen sleep apnea and disrupt normal sleep architecture. Sleep hygiene should be reviewed to assess factors that may improve sleep quality. Weight management and regular exercise should be initiated or continued. Treatment options to be  reviewed with patient by managing physician.  Waymon Budge Diplomate, American Board of Sleep Medicine  ELECTRONICALLY SIGNED ON:  09/03/2015, 9:20 AM  [facility] PH: [phone]   FX: [phone] ACCREDITED BY THE AMERICAN ACADEMY OF SLEEP MEDICINE

## 2015-12-07 ENCOUNTER — Institutional Professional Consult (permissible substitution): Payer: Medicaid Other | Admitting: Internal Medicine

## 2015-12-20 ENCOUNTER — Ambulatory Visit (HOSPITAL_BASED_OUTPATIENT_CLINIC_OR_DEPARTMENT_OTHER): Payer: Medicaid Other | Attending: Otolaryngology | Admitting: Radiology

## 2015-12-20 VITALS — Ht 63.0 in | Wt 118.0 lb

## 2015-12-20 DIAGNOSIS — G4733 Obstructive sleep apnea (adult) (pediatric): Secondary | ICD-10-CM | POA: Diagnosis not present

## 2015-12-20 DIAGNOSIS — Z9989 Dependence on other enabling machines and devices: Secondary | ICD-10-CM

## 2015-12-20 DIAGNOSIS — Z79899 Other long term (current) drug therapy: Secondary | ICD-10-CM | POA: Diagnosis not present

## 2015-12-20 DIAGNOSIS — R0683 Snoring: Secondary | ICD-10-CM | POA: Insufficient documentation

## 2015-12-25 DIAGNOSIS — G4733 Obstructive sleep apnea (adult) (pediatric): Secondary | ICD-10-CM | POA: Diagnosis not present

## 2015-12-25 NOTE — Progress Notes (Signed)
Patient Name: Emily Brock, Blumenstein Date: 12/20/2015 Gender: Female D.O.B: 1956-01-18 Age (years): 59 Referring Provider: Melida Quitter Height (inches): 43 Interpreting Physician: Baird Lyons MD, ABSM Weight (lbs): 118 RPSGT: Laren Everts BMI: 21 MRN: [id number] Neck Size: 14.00 CLINICAL INFORMATION The patient is referred for a split night study with BPAP. Most recent polysomnogram dated 08/24/2015 revealed an AHI of 29.5/h and RDI of 29.5/h. MEDICATIONS Medications taken by the patient : AMBIEN, OXYCODONE HCL   SLEEP STUDY TECHNIQUE As per the AASM Manual for the Scoring of Sleep and Associated Events v2.3 (April 2016) with a hypopnea requiring 4% desaturations. The channels recorded and monitored were frontal, central and occipital EEG, electrooculogram (EOG), submentalis EMG (chin), nasal and oral airflow, thoracic and abdominal wall motion, anterior tibialis EMG, snore microphone, electrocardiogram, and pulse oximetry. Bi-level positive airway pressure (BiPAP) was initiated when the patient met split night criteria and was titrated according to treat sleep-disordered breathing.  RESPIRATORY PARAMETERS Diagnostic Total AHI (/hr): 31.2 RDI (/hr): 35.1 OA Index (/hr): 2.9 CA Index (/hr): 0.0 REM AHI (/hr): N/A NREM AHI (/hr): 31.2 Supine AHI (/hr): 31.2 Non-supine AHI (/hr): N/A Min O2 Sat (%): 80.00 Mean O2 (%): 91.48 Time below 88% (min): 9.6   Titration Optimal IPAP Pressure (cm): 19 Optimal EPAP Pressure (cm): 15 AHI at Optimal Pressure (/hr): 1.0 Min O2 at Optimal Pressure (%): 93.0 Sleep % at Optimal (%): 87 Supine % at Optimal (%): 2      SLEEP ARCHITECTURE The study was initiated at 10:17:54 PM and terminated at 4:30:06 AM. The total recorded time was 372.2 minutes. EEG confirmed total sleep time was 284.0 minutes yielding a sleep efficiency of 76.3%. Sleep onset after lights out was 23.5 minutes with a REM latency of 233.0 minutes. The patient spent 16.55% of the  night in stage N1 sleep, 72.54% in stage N2 sleep, 0.00% in stage N3 and 10.92% in REM. Wake after sleep onset (WASO) was 64.7 minutes. The Arousal Index was 20.5/hour.  LEG MOVEMENT DATA The total Periodic Limb Movements of Sleep (PLMS) were 2. The PLMS index was 0.42 .  CARDIAC DATA The 2 lead EKG demonstrated sinus rhythm. The mean heart rate was 98.93 beats per minute. Other EKG findings include: None.  IMPRESSIONS - Severe obstructive sleep apnea occurred during the diagnostic portion of the study (AHI = 31.2 /hour). An optimal BiPAP pressure was selected for this patient ( 19 / 15 cm of water) - CPAP was changed to BIPAP titration by tech because of uncomfortably high pressure requirement - No significant central sleep apnea occurred during the diagnostic portion of the study (CAI = 0.0/hour). - Mild oxygen desaturation was noted during the diagnostic portion of the study (Min O2 = 80.00%). - The patient snored with Moderate snoring volume during the diagnostic portion of the study. - No cardiac abnormalities were noted during this study. - Clinically significant periodic limb movements of sleep did not occur during the study.  DIAGNOSIS - Obstructive Sleep Apnea (327.23 [G47.33 ICD-10])  RECOMMENDATIONS - Trial of BiPAP therapy on 19/15 cm H2O with a Small size Resmed Full Face Mask AirFit F10 mask and heated humidification. - Avoid alcohol, sedatives and other CNS depressants that may worsen sleep apnea and disrupt normal sleep architecture. - Sleep hygiene should be reviewed to assess factors that may improve sleep quality. - Weight management and regular exercise should be initiated or continued.  [place], American Board of Sleep Medicine  ELECTRONICALLY SIGNED ON:  12/25/2015, 1:51  PM [place] [facility] PH: [phone]   FX: [phone] ACCREDITED BY THE AMERICAN ACADEMY OF SLEEP MEDICINE

## 2016-01-16 ENCOUNTER — Ambulatory Visit (INDEPENDENT_AMBULATORY_CARE_PROVIDER_SITE_OTHER): Payer: Medicaid Other | Admitting: Internal Medicine

## 2016-01-16 ENCOUNTER — Encounter: Payer: Self-pay | Admitting: Internal Medicine

## 2016-01-16 VITALS — BP 118/68 | HR 104 | Ht 63.0 in | Wt 123.6 lb

## 2016-01-16 DIAGNOSIS — G4733 Obstructive sleep apnea (adult) (pediatric): Secondary | ICD-10-CM | POA: Diagnosis not present

## 2016-01-16 NOTE — Patient Instructions (Addendum)
Order- DME Advanced    Autotitrate BIPAP Insp 5-20/   Exp  5-20                           Mask of choice, supplies, humidifier , AirView   Dx OSA  My staff will contact Dr Lamount Cranker office for the results of your initial Home Sleep Test done in the Fall, 2016

## 2016-01-16 NOTE — Progress Notes (Signed)
01/16/2016-60 year old female never smoker referred courtesy of Dr Jenne Pane; pt set up with BiPAP and not able to wear recently due to pressure or mask. DME is [facility].  Here with female partner [facility] Sleep Test 08/24/2015-AHI 29.5 per hour NPSG 12/20/15- severe OSA AHI 31.2/ hr, titration required BIPAP because of high pressure: 19/15 cwp, desat to 80% BIPAP 19/15 Advanced Download-VPAP auto 19/15 Advanced-inadequate compliance with residual AHI 10 per hour She reports snoring, witnessed apneas, unrefreshing nighttime sleep with daytime tiredness.. Sleep habits reviewed. She has been trying to use BiPAP but is bothered by leaks and high pressure. ENT surgery-sinus surgery in 2000. Previous neck/back surgeries.  Prior to Admission medications   Medication Sig Start Date End Date Taking? Authorizing Provider  albuterol (PROVENTIL HFA;VENTOLIN HFA) 108 (90 BASE) MCG/ACT inhaler Inhale 2 puffs into the lungs every 6 (six) hours as needed.     Yes Historical Provider, MD  Calcium Carbonate-Vitamin D (CALCIUM + D PO) Take 1 tablet by mouth daily.     Yes Historical Provider, MD  FLECTOR 1.3 % [facility]  12/15/15  Yes Historical Provider, MD  folic acid (FOLVITE) 1 MG tablet  12/21/15  Yes Historical Provider, MD  ibandronate (BONIVA) 150 MG tablet  01/12/16  Yes Historical Provider, MD  oxyCODONE (OXY IR/ROXICODONE) 5 MG immediate release tablet take 1 tablet by mouth UP TO 2 PER DAY FOR BREAKTHROUGH PAIN 01/12/16  Yes Historical Provider, MD  PULMICORT 0.5 MG/2ML nebulizer solution  12/15/15  Yes Historical Provider, MD  zolpidem (AMBIEN) 10 MG tablet Take 10 mg by mouth at bedtime as needed.     Yes Historical Provider, MD  Fluticasone Propionate, Inhal, (FLOVENT DISKUS) 100 MCG/BLIST AEPB Inhale 1 puff into the lungs 2 (two) times daily. Reported on 01/16/2016    Historical Provider, MD   Past Medical History  Diagnosis Date  . Asthma   . Recurrent sinus infections    Past Surgical History  Procedure  Laterality Date  . Back surgery    . Cholecystectomy    . Sinus exploration    . Shoulder arthroscopy    . Thoracic scapular infusion  2013  . Right foot surgery     No family history on file. Social History   Social History  . Marital Status: Single    Spouse Name: N/A  . Number of Children: N/A  . Years of Education: N/A   Occupational History  . Not on file.   Social History Main Topics  . Smoking status: Never Smoker   . Smokeless tobacco: Not on file  . Alcohol Use: No  . Drug Use: No  . Sexual Activity: Not on file   Other Topics Concern  . Not on file   Social History Narrative   ROS-see HPI   Negative unless "+" Constitutional:    weight loss, night sweats, fevers, chills, fatigue, lassitude. HEENT:    headaches, difficulty swallowing, tooth/dental problems, sore throat,       sneezing, itching, ear ache, nasal congestion, post nasal drip, snoring CV:    chest pain, orthopnea, PND, swelling in lower extremities, anasarca,                                                  dizziness, palpitations Resp:   shortness of breath with exertion or at rest.  productive cough,   non-productive cough, coughing up of blood.              change in color of mucus.  wheezing.   Skin:    rash or lesions. GI:  No-   heartburn, indigestion, abdominal pain, nausea, vomiting, diarrhea,                 change in bowel habits, loss of appetite GU: dysuria, change in color of urine, no urgency or frequency.   flank pain. MS:   joint pain, stiffness, decreased range of motion, back pain. Neuro-     nothing unusual Psych:  change in mood or affect.  depression or anxiety.   memory loss.  OBJ- Physical Exam General- Alert, Oriented, Affect-appropriate, Distress- none acute, medium build  Skin- rash-none, lesions- none, excoriation- none Lymphadenopathy- none Head- atraumatic            Eyes- Gross vision intact, PERRLA, conjunctivae and secretions clear             Ears- Hearing, canals-normal            Nose- Clear, no-Septal dev, mucus, polyps, erosion, perforation             Throat- Mallampati II-III , mucosa clear , drainage- none, tonsils- atrophic, own teeth Neck- flexible , trachea midline, no stridor , thyroid nl, carotid no bruit Chest - symmetrical excursion , unlabored           Heart/CV- RRR , no murmur , no gallop  , no rub, nl s1 s2                           - JVD- none , edema- none, stasis changes- none, varices- none           Lung- clear to P&A, wheeze- none, cough- none , dullness-none, rub- none           Chest wall-  Abd-  Br/ Gen/ Rectal- Not done, not indicated Extrem- cyanosis- none, clubbing, none, atrophy- none, strength- nl Neuro- grossly intact to observation

## 2016-01-18 ENCOUNTER — Telehealth: Payer: Self-pay | Admitting: Internal Medicine

## 2016-01-18 DIAGNOSIS — G4733 Obstructive sleep apnea (adult) (pediatric): Secondary | ICD-10-CM | POA: Insufficient documentation

## 2016-01-18 NOTE — Telephone Encounter (Signed)
New order placed. Called and left detailed message for Melissa letting her know order was placed and to call back if anything else is needed. Nothing further needed at this time.

## 2016-01-18 NOTE — Telephone Encounter (Signed)
Called and spoke to Emily Brock with [facility]. Melissa stated the pt's current Bipap order is incorrect because both IPAP and EPAP have a set range at 5-20, which it cannot. Melissa stated the order needs to have a "max IPAP" (ex: 20) and a "min EPAP" (ex: 5) and a "set pressure support" (ex: 4 - current pressure support pt is at).  Dr. Maple Hudson please advise. Thanks.

## 2016-01-18 NOTE — Telephone Encounter (Signed)
Ok change order to read as she suggests. Thanks

## 2016-01-18 NOTE — Assessment & Plan Note (Signed)
2 sleep studies demonstrate severe obstructive sleep apnea, consistent with her history. Examination wouldn't predict this much trouble, or need for such high Pap pressures. She is not overweight and pharynx anatomy is unremarkable. She is not tolerating the high pressure. Plan-auto titrate BiPAP for another look at pressure requirements

## 2016-01-19 ENCOUNTER — Telehealth: Payer: Self-pay | Admitting: Internal Medicine

## 2016-01-19 NOTE — Telephone Encounter (Signed)
Patient calling to check on the status of her BiPAP settings change.  Order was placed yesterday and confirmed receipt received from [facility] at [facility] today.  Advised patient that the order was placed yesterday and that [facility] has not had time to process the order yet. She requests that we contact [facility] and ask them if they can get the changes made before the weekend because she is very tired and she wants to be able to use her BiPAP this weekend to get some rest.  Message sent to [facility], awaiting response from [place].

## 2016-01-20 NOTE — Telephone Encounter (Signed)
Called Melissa and LMTCB x1 

## 2016-01-20 NOTE — Telephone Encounter (Signed)
Melissa Stenson  Tommie Sams, CMA; Karleen Hampshire, CMA           Emily Brock, I got your v/m message. This pt has been taken care of today and she is very happy.  Thanks

## 2016-01-25 ENCOUNTER — Encounter: Payer: Self-pay | Admitting: Internal Medicine

## 2016-02-20 ENCOUNTER — Encounter: Payer: Self-pay | Admitting: Internal Medicine

## 2016-02-20 ENCOUNTER — Other Ambulatory Visit (INDEPENDENT_AMBULATORY_CARE_PROVIDER_SITE_OTHER): Payer: Medicaid Other

## 2016-02-20 ENCOUNTER — Ambulatory Visit (INDEPENDENT_AMBULATORY_CARE_PROVIDER_SITE_OTHER): Payer: Medicaid Other | Admitting: Internal Medicine

## 2016-02-20 VITALS — BP 120/76 | HR 108 | Ht 63.0 in | Wt 125.8 lb

## 2016-02-20 DIAGNOSIS — E039 Hypothyroidism, unspecified: Secondary | ICD-10-CM

## 2016-02-20 DIAGNOSIS — G4733 Obstructive sleep apnea (adult) (pediatric): Secondary | ICD-10-CM | POA: Diagnosis not present

## 2016-02-20 LAB — CBC WITH DIFFERENTIAL/PLATELET
BASOS ABS: 0.1 10*3/uL (ref 0.0–0.1)
BASOS PCT: 0.8 % (ref 0.0–3.0)
EOS ABS: 0.1 10*3/uL (ref 0.0–0.7)
Eosinophils Relative: 1.5 % (ref 0.0–5.0)
HEMATOCRIT: 40.7 % (ref 36.0–46.0)
HEMOGLOBIN: 13.9 g/dL (ref 12.0–15.0)
LYMPHS PCT: 22 % (ref 12.0–46.0)
Lymphs Abs: 2.1 10*3/uL (ref 0.7–4.0)
MCHC: 34.2 g/dL (ref 30.0–36.0)
MCV: 85.8 fl (ref 78.0–100.0)
MONO ABS: 0.8 10*3/uL (ref 0.1–1.0)
Monocytes Relative: 8.5 % (ref 3.0–12.0)
Neutro Abs: 6.4 10*3/uL (ref 1.4–7.7)
Neutrophils Relative %: 67.2 % (ref 43.0–77.0)
Platelets: 416 10*3/uL — ABNORMAL HIGH (ref 150.0–400.0)
RBC: 4.75 Mil/uL (ref 3.87–5.11)
RDW: 12.9 % (ref 11.5–15.5)
WBC: 9.5 10*3/uL (ref 4.0–10.5)

## 2016-02-20 LAB — TSH: TSH: 0.44 u[IU]/mL (ref 0.35–4.50)

## 2016-02-20 MED ORDER — METHYLPHENIDATE HCL 5 MG PO TABS: 5.0000 mg | ORAL_TABLET | Freq: Two times a day (BID) | ORAL | Status: DC

## 2016-02-20 NOTE — Patient Instructions (Addendum)
Ok to keep trying your VPAP at current settings  Script printed for ritalin 5 mg,    1 twice daily as needed. Don't take later than 2:00 pm if it bothers your sleep.  Order- lab    TSH, CBC w diff  Dx hypothyroid

## 2016-02-20 NOTE — Progress Notes (Signed)
01/16/2016-60 year old female never smoker referred courtesy of Dr Jenne PaneBates; pt set up with BiPAP and not able to wear recently due to pressure or mask. DME is [facility].  [facility]-AHI 29.5 per hour NPSG 12/20/15- severe OSA AHI 31.2/ hr, titration required BIPAP because of high pressure: 19/15 cwp, desat to 80% BIPAP 19/15 Advanced Download-VPAP auto 19/15 Advanced-inadequate compliance with residual AHI 10 per hour She reports snoring, witnessed apneas, unrefreshing nighttime sleep with daytime tiredness.. Sleep habits reviewed. She has been trying to use BiPAP but is bothered by leaks and high pressure. ENT surgery-sinus surgery in 2000. Previous neck/back surgeries.  02/20/2016-60 year old female never smoker followed for OSA BIPAP auto 5-20/ Advanced FOLLOWS FOR:DME:[facility]. Pt states she is having trouble with her BiPAP settings-wakes up exhausted; DL is attached to OV notes. "Dragging". Husband says she sleeps better with her BiPAP machine. She has tried to be very compliant as documented by download, with adequate control, but feels she sleeps better without BiPAP. Primary complaint is a persistent sense of "not energized"-not really sleepy and does not take naps.  ROS-see HPI   Negative unless "+" Constitutional:    weight loss, night sweats, fevers, chills, fatigue, lassitude. HEENT:    headaches, difficulty swallowing, tooth/dental problems, sore throat,       sneezing, itching, ear ache, nasal congestion, post nasal drip, snoring CV:    chest pain, orthopnea, PND, swelling in lower extremities, anasarca,                                                  dizziness, palpitations Resp:   shortness of breath with exertion or at rest.                productive cough,   non-productive cough, coughing up of blood.              change in color of mucus.  wheezing.   Skin:    rash or lesions. GI:  No-   heartburn, indigestion, abdominal pain, nausea,  vomiting, diarrhea,                 change in bowel habits, loss of appetite GU: dysuria, change in color of urine, no urgency or frequency.   flank pain. MS:   joint pain, stiffness, decreased range of motion, back pain. Neuro-     nothing unusual Psych:  change in mood or affect.  depression or anxiety.   memory loss.  OBJ- Physical Exam General- Alert, Oriented, Affect-appropriate, Distress- none acute, medium build  Skin- rash-none, lesions- none, excoriation- none Lymphadenopathy- none Head- atraumatic            Eyes- Gross vision intact, PERRLA, conjunctivae and secretions clear            Ears- Hearing, canals-normal            Nose- Clear, no-Septal dev, mucus, polyps, erosion, perforation             Throat- Mallampati II-III , mucosa clear , drainage- none, tonsils- atrophic, own teeth Neck- flexible , trachea midline, no stridor , thyroid nl, carotid no bruit Chest - symmetrical excursion , unlabored           Heart/CV- RRR , no murmur , no gallop  , no rub, nl s1 s2                           -  JVD- none , edema- none, stasis changes- none, varices- none           Lung- clear to P&A, wheeze- none, cough- none , dullness-none, rub- none           Chest wall-  Abd-  Br/ Gen/ Rectal- Not done, not indicated Extrem- cyanosis- none, clubbing, none, atrophy- none, strength- nl Neuro- grossly intact to observation

## 2016-02-21 NOTE — Assessment & Plan Note (Addendum)
Obstructive sleep apnea component should be adequately covered based on her download. I suspect the residual "not energized" sensation is not sleepiness. Eventually she may want to try a nonsedating antidepressant. She did agree to try low-dose Ritalin after some discussion. She will really wasn't looking for stimulant medication. Eventually also may want to try repeat sleep study followed by [facility]. Plan-continue VPAP at present settings. TSH to exclude hypothyroid, CBC to exclude anemia

## 2016-02-28 ENCOUNTER — Telehealth: Payer: Self-pay | Admitting: Internal Medicine

## 2016-02-28 ENCOUNTER — Encounter: Payer: Self-pay | Admitting: Internal Medicine

## 2016-02-28 DIAGNOSIS — G4733 Obstructive sleep apnea (adult) (pediatric): Secondary | ICD-10-CM

## 2016-02-28 NOTE — Telephone Encounter (Signed)
Per 02/20/16 OV: Patient Instructions       Ok to keep trying your VPAP at current settings Script printed for ritalin 5 mg,    1 twice daily as needed. Don't take later than 2:00 pm if it bothers your sleep. Order- lab    TSH, CBC w diff  Dx hypothyroid  ---  ATC pt at # provided, received message the # you have dialed is incorrect.  Called home #--LMTCB x1

## 2016-02-29 NOTE — Telephone Encounter (Signed)
Spoke with pt, aware of recs.  Order placed to dme to switch back to cpap.  Nothing further needed.

## 2016-02-29 NOTE — Telephone Encounter (Signed)
Please ask her DME to change PAP order to CPAP auto 5-20  For dx OSA

## 2016-02-29 NOTE — Telephone Encounter (Signed)
Called spoke with pt. She reports she has not used her BIPAP in the past 3 nights. She slept 8 hrs last night w/o it. She reports she is still unable to tolerate the BIPAP. She is not sure what else to do. She reports she is really trying to use it Please advise Dr. Maple HudsonYoung thanks

## 2016-03-05 ENCOUNTER — Telehealth: Payer: Self-pay | Admitting: Internal Medicine

## 2016-03-05 DIAGNOSIS — G4733 Obstructive sleep apnea (adult) (pediatric): Secondary | ICD-10-CM

## 2016-03-05 NOTE — Telephone Encounter (Signed)
LMTCB

## 2016-03-06 NOTE — Telephone Encounter (Signed)
Pt returning call and can be reached @ same.Stanley A Dalton ° °

## 2016-03-06 NOTE — Telephone Encounter (Signed)
Spoke with pt, advised her of below recs.  States this is unacceptable.  Pt [facility] and see what else we can do, because she feels like she cannot go another month on this machine.    Will await call back from pt.

## 2016-03-06 NOTE — Telephone Encounter (Signed)
Spoke with the pt  She states [facility] needing further information to get her back on CPAP   [facility]  She states that the issue is that the pt tried and failed CPAP, then started on BIPAP, now wants CPAP again and medicaid will not pay for another CPAP right now  She states that her respiratory manager suggests that she keep the BIPAP and have EPAP at a min of 5, IPAP max 20 and pressure support of 0  If she tolerates this for a month, they may say it's ok to go back to CPAP

## 2016-03-06 NOTE — Telephone Encounter (Signed)
Pt returning call.Emily Brock ° °

## 2016-03-06 NOTE — Telephone Encounter (Signed)
LMTCB

## 2016-03-06 NOTE — Telephone Encounter (Signed)
Ok to order trial of her machine set at the settings suggested by DME. Thanks. Please let her know we are trying.

## 2016-03-09 NOTE — Telephone Encounter (Signed)
Spoke with the pt  She states [facility] she feels more comfortable with changing BIPAP settings as suggested  [facility]

## 2016-05-15 ENCOUNTER — Ambulatory Visit: Payer: Medicaid Other | Admitting: Internal Medicine

## 2016-11-27 ENCOUNTER — Other Ambulatory Visit: Payer: Self-pay | Admitting: Internal Medicine

## 2016-11-27 DIAGNOSIS — M810 Age-related osteoporosis without current pathological fracture: Secondary | ICD-10-CM

## 2016-11-27 DIAGNOSIS — Z1231 Encounter for screening mammogram for malignant neoplasm of breast: Secondary | ICD-10-CM

## 2016-12-24 ENCOUNTER — Ambulatory Visit
Admission: RE | Admit: 2016-12-24 | Discharge: 2016-12-24 | Disposition: A | Discharge status: Home / Self Care | Payer: Medicaid Other | Source: Ambulatory Visit | Attending: Internal Medicine | Admitting: Internal Medicine

## 2016-12-24 DIAGNOSIS — Z1231 Encounter for screening mammogram for malignant neoplasm of breast: Secondary | ICD-10-CM

## 2017-06-04 ENCOUNTER — Other Ambulatory Visit: Payer: Self-pay | Admitting: Internal Medicine

## 2017-06-04 DIAGNOSIS — M810 Age-related osteoporosis without current pathological fracture: Secondary | ICD-10-CM

## 2017-07-29 ENCOUNTER — Other Ambulatory Visit: Payer: Medicaid Other

## 2017-08-06 ENCOUNTER — Ambulatory Visit
Admission: RE | Admit: 2017-08-06 | Discharge: 2017-08-06 | Disposition: A | Discharge status: Home / Self Care | Payer: Medicaid Other | Source: Ambulatory Visit | Attending: Internal Medicine | Admitting: Internal Medicine

## 2017-08-06 DIAGNOSIS — M810 Age-related osteoporosis without current pathological fracture: Secondary | ICD-10-CM

## 2017-11-20 ENCOUNTER — Other Ambulatory Visit: Payer: Self-pay | Admitting: Internal Medicine

## 2017-11-20 DIAGNOSIS — Z1231 Encounter for screening mammogram for malignant neoplasm of breast: Secondary | ICD-10-CM

## 2017-12-20 ENCOUNTER — Ambulatory Visit: Payer: Self-pay

## 2018-01-21 ENCOUNTER — Other Ambulatory Visit: Payer: Self-pay | Admitting: Internal Medicine

## 2018-01-21 ENCOUNTER — Ambulatory Visit
Admission: RE | Admit: 2018-01-21 | Discharge: 2018-01-21 | Disposition: A | Discharge status: Home / Self Care | Payer: Medicaid Other | Source: Ambulatory Visit | Attending: Internal Medicine | Admitting: Internal Medicine

## 2018-01-21 DIAGNOSIS — Z1231 Encounter for screening mammogram for malignant neoplasm of breast: Secondary | ICD-10-CM

## 2018-05-08 ENCOUNTER — Other Ambulatory Visit: Payer: Self-pay | Admitting: Physical Medicine & Rehabilitation

## 2018-05-08 DIAGNOSIS — M7581 Other shoulder lesions, right shoulder: Secondary | ICD-10-CM

## 2018-05-14 ENCOUNTER — Ambulatory Visit
Admission: RE | Admit: 2018-05-14 | Discharge: 2018-05-14 | Disposition: A | Discharge status: Home / Self Care | Payer: Medicaid Other | Source: Ambulatory Visit | Attending: Physical Medicine & Rehabilitation | Admitting: Physical Medicine & Rehabilitation

## 2018-05-14 DIAGNOSIS — M7581 Other shoulder lesions, right shoulder: Secondary | ICD-10-CM

## 2019-07-10 ENCOUNTER — Other Ambulatory Visit: Payer: Self-pay | Admitting: Internal Medicine

## 2019-07-10 DIAGNOSIS — E2839 Other primary ovarian failure: Secondary | ICD-10-CM

## 2019-07-10 DIAGNOSIS — Z1231 Encounter for screening mammogram for malignant neoplasm of breast: Secondary | ICD-10-CM

## 2019-10-06 ENCOUNTER — Ambulatory Visit: Payer: Medicaid Other

## 2019-10-06 ENCOUNTER — Other Ambulatory Visit: Payer: Self-pay

## 2019-10-06 ENCOUNTER — Ambulatory Visit
Admission: RE | Admit: 2019-10-06 | Discharge: 2019-10-06 | Disposition: A | Discharge status: Home / Self Care | Payer: Medicaid Other | Source: Ambulatory Visit | Attending: Internal Medicine | Admitting: Internal Medicine

## 2019-10-06 DIAGNOSIS — Z1231 Encounter for screening mammogram for malignant neoplasm of breast: Secondary | ICD-10-CM

## 2019-10-06 DIAGNOSIS — E2839 Other primary ovarian failure: Secondary | ICD-10-CM

## 2021-01-24 ENCOUNTER — Other Ambulatory Visit: Payer: Self-pay | Admitting: Internal Medicine

## 2021-01-25 LAB — LIPID PANEL
Cholesterol: 189 mg/dL (ref <200)
HDL: 58 mg/dL (ref >=50)
LDL Cholesterol (Calc): 109 mg/dL (calc) — ABNORMAL HIGH
Non-HDL Cholesterol (Calc): 131 mg/dL (calc) — ABNORMAL HIGH (ref <130)
Total CHOL/HDL Ratio: 3.3 (calc) (ref <5.0)
Triglycerides: 116 mg/dL (ref <150)

## 2021-01-25 LAB — CBC
HCT: 43.2 % (ref 35.0–45.0)
Hemoglobin: 14.4 g/dL (ref 11.7–15.5)
MCH: 29.8 pg (ref 27.0–33.0)
MCHC: 33.3 g/dL (ref 32.0–36.0)
MCV: 89.3 fL (ref 80.0–100.0)
MPV: 9.2 fL (ref 7.5–12.5)
Platelets: 367 10*3/uL (ref 140–400)
RBC: 4.84 10*6/uL (ref 3.80–5.10)
RDW: 11.6 % (ref 11.0–15.0)
WBC: 7.8 10*3/uL (ref 3.8–10.8)

## 2021-01-25 LAB — VITAMIN D 25 HYDROXY (VIT D DEFICIENCY, FRACTURES): Vit D, 25-Hydroxy: 42 ng/mL (ref 30–100)

## 2021-01-25 LAB — COMPLETE METABOLIC PANEL WITH GFR
AG Ratio: 1.6 (calc) (ref 1.0–2.5)
ALT: 11 U/L (ref 6–29)
AST: 13 U/L (ref 10–35)
Albumin: 4.5 g/dL (ref 3.6–5.1)
Alkaline phosphatase (APISO): 40 U/L (ref 37–153)
BUN: 16 mg/dL (ref 7–25)
CO2: 26 mmol/L (ref 20–32)
Calcium: 9.7 mg/dL (ref 8.6–10.4)
Chloride: 101 mmol/L (ref 98–110)
Creat: 0.82 mg/dL (ref 0.50–0.99)
GFR, Est African American: 87 mL/min/{1.73_m2} (ref >=60)
GFR, Est Non African American: 75 mL/min/{1.73_m2} (ref >=60)
Globulin: 2.8 g/dL (calc) (ref 1.9–3.7)
Glucose, Bld: 94 mg/dL (ref 65–99)
Potassium: 4.6 mmol/L (ref 3.5–5.3)
Sodium: 137 mmol/L (ref 135–146)
Total Bilirubin: 0.5 mg/dL (ref 0.2–1.2)
Total Protein: 7.3 g/dL (ref 6.1–8.1)

## 2021-01-25 LAB — TSH: TSH: 0.63 mIU/L (ref 0.40–4.50)

## 2021-02-03 ENCOUNTER — Other Ambulatory Visit: Payer: Self-pay

## 2021-02-03 ENCOUNTER — Encounter: Payer: Self-pay | Admitting: Physical Therapy

## 2021-02-03 ENCOUNTER — Ambulatory Visit: Payer: Medicare Other | Attending: Orthopedic Surgery | Admitting: Physical Therapy

## 2021-02-03 DIAGNOSIS — M25511 Pain in right shoulder: Secondary | ICD-10-CM | POA: Diagnosis present

## 2021-02-03 DIAGNOSIS — M25611 Stiffness of right shoulder, not elsewhere classified: Secondary | ICD-10-CM | POA: Diagnosis present

## 2021-02-05 ENCOUNTER — Encounter: Payer: Self-pay | Admitting: Physical Therapy

## 2021-02-05 NOTE — Patient Instructions (Signed)
Access Code: [email or web address]: [email or web address]: 02/20/2022Prepared by: Onalee Hua CarrollExercises  Supine Shoulder External Rotation in 45 Degrees Abduction AAROM with Dowel - 2 x daily - 7 x weekly - 1 sets - 5 reps - 10sec hold  Circular Shoulder Pendulum with Table Support - 3 x daily - 7 x weekly - 1-2 minutes hold

## 2021-02-05 NOTE — Therapy (Signed)
[facility] [address] [place], [place], [address] Phone: [phone]   Fax:  [phone]  Physical Therapy Evaluation  Patient Details  Name: MARCH JOOS MRN: [id number] Date of Birth: 1956/06/28 Referring Provider (PT): Dr Vale Haven   Encounter Date: 02/03/2021   PT End of Session - 02/05/21 1344    Visit Number 1    Number of Visits 12    Date for PT Re-Evaluation 03/17/21    Authorization Type MCR/ MCD    PT Start Time 1315    PT Stop Time 1400    PT Time Calculation (min) 45 min    Activity Tolerance Patient tolerated treatment well    Behavior During Therapy [facility] for tasks assessed/performed           Past Medical History:  Diagnosis Date  . Asthma   . Recurrent sinus infections     Past Surgical History:  Procedure Laterality Date  . BACK SURGERY    . CHOLECYSTECTOMY    . right foot surgery    . SHOULDER ARTHROSCOPY    . SINUS EXPLORATION    . thoracic scapular infusion  2013    There were no vitals filed for this visit.    Subjective Assessment - 02/05/21 1325    Subjective Patient had a lysis of adhesions in her right shoulder on 2/10 202. She has an extensiove hisotry of shoulder history including a scapulo thoracic fusion x2. She has had 6 total surgeries on her shoulder. Her pain is currently well contirled.    Pertinent History Multi shoulder surgery    Limitations Lifting;Writing    How long can you sit comfortably? N/A    How long can you stand comfortably? N/A    How long can you walk comfortably? N/A    Diagnostic tests nothing post op    Patient Stated Goals to be able to use her arm with less pain    Currently in Pain? Yes   with movement   Pain Score 7     Pain Location Shoulder    Pain Orientation Right    Pain Descriptors / Indicators Aching    Pain Type Chronic pain;Surgical pain    Pain Onset More than a month ago    Pain Frequency Intermittent    Aggravating Factors  use  of her shoulder    Pain Relieving Factors rest    Effect of Pain on Daily Activities difficulty perfroming ADL's              [facility] PT Assessment - 02/05/21 0001      Assessment   Medical Diagnosis R Shoulder Lysis of Adhesions    Referring Provider (PT) Dr Vale Haven    Onset Date/Surgical Date 01/26/21    Hand Dominance Right    Next MD Visit Monday    Prior Therapy For prior shoulder surgeries      Precautions   Precautions None      Restrictions   Weight Bearing Restrictions No      Balance Screen   Has the patient fallen in the past 6 months Yes    How many times? 1    Has the patient had a decrease in activity level because of a fear of falling?  No    Is the patient reluctant to leave their home because of a fear of falling?  No      Home Environment   Additional Comments nothing significant  Prior Function   Level of Independence Independent    Vocation Retired    Leisure home renovations, gardening and cooking      Cognition   Overall Cognitive Status Within [employer] for tasks assessed    Attention Focused    Focused Attention Appears intact    Memory Appears intact    Awareness Appears intact    Problem Solving Appears intact      Observation/Other Assessments   Focus on Therapeutic Outcomes (FOTO)  40% ability 58% expected      Sensation   Light Touch Appears Intact    Additional Comments denies parasthesias      Coordination   Gross Motor Movements are Fluid and Coordinated Yes    Fine Motor Movements are Fluid and Coordinated Yes      AROM   Overall AROM Comments not tested 2nd to potential RTC repair      PROM   Right/Left Shoulder Right    Right Shoulder Flexion 120 Degrees   mild pain   Right Shoulder Internal Rotation 70 Degrees    Right Shoulder External Rotation 55 Degrees   mild pain     Strength   Overall Strength Comments not tested because of poetential RTC repair      Palpation   Palpation comment no  unexpected tenderness to palpation                      Objective measurements completed on examination: See above findings.       [facility] Adult PT Treatment/Exercise - 02/05/21 0001      Shoulder Exercises: Stretch   Other Shoulder Stretches wan er 5 x10 sec hold;    Other Shoulder Stretches pendulums with cuing for techique      Manual Therapy   Manual Therapy Soft tissue mobilization;Joint mobilization;Passive ROM    Joint Mobilization PA and inferior glids grade 1 and II    Soft tissue mobilization to posterior shoulder and upper trap    Passive ROM into all planes                  PT Education - 02/05/21 1343    Education Details reviewed improtanc of improving motion; progression of activity    Person(s) Educated Patient    Methods Explanation;Demonstration;Tactile cues;Verbal cues    Comprehension Verbalized understanding;Returned demonstration;Verbal cues required;Tactile cues required            PT Short Term Goals - 02/05/21 1411      PT SHORT TERM GOAL #1   Title Patient will increase passive shoulder flexion to 145 degrees and passive ER to 70 degrees    Time 3    Period Weeks    Status New    Target Date 02/26/21      PT SHORT TERM GOAL #2   Title Patient will increase gross right shoulder strength to 4+/5 in available ranges    Time 3    Period Weeks    Status New    Target Date 02/26/21      PT SHORT TERM GOAL #3   Title Patint will be indepdent with basic stretching and strengthening program    Time 3    Period Weeks    Status New    Target Date 02/26/21             PT Long Term Goals - 02/05/21 1423      PT LONG TERM GOAL #1   Title Patient  will reach overhead with a 2lb object without pain in order to put her dishes away    Time 6    Period Weeks    Status New      PT LONG TERM GOAL #2   Title Patient will demonstrate full functional motion without pin in order to perfrom ADL's with all movements    Time 6     Period Weeks    Status New    Target Date 03/19/21                  Plan - 02/05/21 1352    Clinical Impression Statement Patient is a 65 year old female S/P lysis of adhesions on 01/26/2021. She presents with expected limiations in motion and strength. Her pain is controlled. She has an extensive history of shoulder surgeries. Her range is doing well cosidering patients self reported past hisotry of movement and pain. She would benefit from skilled therapy to improve shoulder motion and function.    Personal Factors and Comorbidities Comorbidity 1    Comorbidities past scapulothoracic fusion    Examination-Activity Limitations Lift;Carry;Reach Overhead    Examination-Participation Restrictions Cleaning;Community Activity;Laundry;Shop    Stability/Clinical Decision Making Stable/Uncomplicated    Clinical Decision Making Low    Rehab Potential Good    PT Frequency 1x / week    PT Duration 6 weeks    PT Treatment/Interventions ADLs/Self Care Home Management;Electrical Stimulation;Cryotherapy;Ultrasound;DME Instruction;Gait training;Stair training;Therapeutic exercise;Therapeutic activities;Patient/family education;Neuromuscular re-education;Manual techniques;Passive range of motion;Dry needling;Splinting;Taping    PT Next Visit Plan Patient has an appointment with MD Monday. She feesl like she may have had an RTC repair. Her notes only say lysis of adhesions. She will ask on Monday. Judging by her pain, and motion and the fact the MD took her out her sling after 3 days, she just has a lysis. If thats the case progress with AAROM progression for IR flexion and ER. Progreess scpaular strengthening.    PT Home Exercise Plan wand ER and pendulums    Consulted and Agree with Plan of Care Patient           Patient will benefit from skilled therapeutic intervention in order to improve the following deficits and impairments:  Decreased range of motion,Pain,Impaired UE functional use,Decreased  strength,Decreased activity tolerance,Decreased endurance  Visit Diagnosis: Stiffness of right shoulder, not elsewhere classified  Acute pain of right shoulder     Problem List Patient Active Problem List   Diagnosis Date Noted  . Obstructive sleep apnea 01/18/2016    Dessie Coma PT DPT  02/05/2021, 2:26 PM  [facility] [address] [place], [place], [address] Phone: [phone]   Fax:  [phone]  Name: ANUM PALECEK MRN: [id number] Date of Birth: November 25, 1956

## 2021-02-07 ENCOUNTER — Other Ambulatory Visit: Payer: Self-pay

## 2021-02-07 ENCOUNTER — Ambulatory Visit: Payer: Medicare Other | Admitting: Physical Therapy

## 2021-02-07 ENCOUNTER — Encounter: Payer: Self-pay | Admitting: Physical Therapy

## 2021-02-07 DIAGNOSIS — M25611 Stiffness of right shoulder, not elsewhere classified: Secondary | ICD-10-CM

## 2021-02-07 DIAGNOSIS — M25511 Pain in right shoulder: Secondary | ICD-10-CM

## 2021-02-07 NOTE — Therapy (Signed)
[facility] [address] [place], [place], [address] Phone: [phone]   Fax:  [phone]  Physical Therapy Treatment  Patient Details  Name: Emily Brock MRN: [id number] Date of Birth: 03-01-1956 Referring Provider (PT): Dr Vale Haven   Encounter Date: 02/07/2021   PT End of Session - 02/07/21 1508    Visit Number 2    Number of Visits 12    Date for PT Re-Evaluation 03/17/21    Authorization Type MCR/ MCD    PT Start Time 1220    PT Stop Time 1300    PT Time Calculation (min) 40 min    Activity Tolerance Patient tolerated treatment well    Behavior During Therapy [facility] for tasks assessed/performed           Past Medical History:  Diagnosis Date  . Asthma   . Recurrent sinus infections     Past Surgical History:  Procedure Laterality Date  . BACK SURGERY    . CHOLECYSTECTOMY    . right foot surgery    . SHOULDER ARTHROSCOPY    . SINUS EXPLORATION    . thoracic scapular infusion  2013    There were no vitals filed for this visit.   Subjective Assessment - 02/07/21 1227    Subjective Pt brought her operative report, pain is 5/10 "biting". The doctor said I dont have any restrictions really. I use it but i dont over do it.    Currently in Pain? Yes    Pain Score 5     Pain Location Shoulder    Pain Orientation Right    Pain Descriptors / Indicators Other (Comment)   biting   Pain Type Chronic pain;Surgical pain    Pain Onset More than a month ago    Aggravating Factors  overdoing it    Pain Relieving Factors rest , activity modifcation    Multiple Pain Sites No                             [facility] Adult PT Treatment/Exercise - 02/07/21 0001      Self-Care   Self-Care Other Self-Care Comments;Heat/Ice Application    Heat/Ice Application ice post    Other Self-Care Comments  HEP, bands scapualr position, avoid lifting with RT UE , listen to the pain      Shoulder Exercises: Supine    External Rotation Right;15 reps    Flexion 15 reps;Both    Other Supine Exercises chest press x 15 dowel (and for above exercise)      Shoulder Exercises: Standing   External Rotation 10 reps;Right    Theraband Level (Shoulder External Rotation) Level 2 (Red)    Extension Both;15 reps    Theraband Level (Shoulder Extension) Level 2 (Red)    Row 15 reps    Theraband Level (Shoulder Row) Level 2 (Red)      Manual Therapy   Passive ROM into all planes, gentle                  PT Education - 02/07/21 1236    Education Details HEP    Person(s) Educated Patient    Methods Explanation;Handout    Comprehension Verbalized understanding            PT Short Term Goals - 02/05/21 1411      PT SHORT TERM GOAL #1   Title Patient will increase passive shoulder flexion to 145 degrees and  passive ER to 70 degrees    Time 3    Period Weeks    Status New    Target Date 02/26/21      PT SHORT TERM GOAL #2   Title Patient will increase gross right shoulder strength to 4+/5 in available ranges    Time 3    Period Weeks    Status New    Target Date 02/26/21      PT SHORT TERM GOAL #3   Title Patint will be indepdent with basic stretching and strengthening program    Time 3    Period Weeks    Status New    Target Date 02/26/21             PT Long Term Goals - 02/05/21 1423      PT LONG TERM GOAL #1   Title Patient will reach overhead with a 2lb object without pain in order to put her dishes away    Time 6    Period Weeks    Status New      PT LONG TERM GOAL #2   Title Patient will demonstrate full functional motion without pin in order to perfrom ADL's with all movements    Time 6    Period Weeks    Status New    Target Date 03/19/21                 Plan - 02/07/21 1222    Clinical Impression Statement Patient is doing well overall, pain is moderate but tolerates exercises without difficulty.  She needs min cues to isolate planes of motions and has min  shoulder hike.  She has excellent range considering mutliple surgeries.  Given full HEP and advised ice post for soreness and inflammation.    PT Treatment/Interventions ADLs/Self Care Home Management;Electrical Stimulation;Cryotherapy;Ultrasound;DME Instruction;Gait training;Stair training;Therapeutic exercise;Therapeutic activities;Patient/family education;Neuromuscular re-education;Manual techniques;Passive range of motion;Dry needling;Splinting;Taping    PT Next Visit Plan Progress strength as tolerated, ROM with control and form a priority.  pulleys, isometric for flex and abd?    PT Home Exercise Plan Access Code: [email or web address]           Patient will benefit from skilled therapeutic intervention in order to improve the following deficits and impairments:  Decreased range of motion,Pain,Impaired UE functional use,Decreased strength,Decreased activity tolerance,Decreased endurance  Visit Diagnosis: Stiffness of right shoulder, not elsewhere classified  Acute pain of right shoulder     Problem List Patient Active Problem List   Diagnosis Date Noted  . Obstructive sleep apnea 01/18/2016    , 02/07/2021, 3:16 PM  [facility] [address] [place], [place], [address] Phone: [phone]   Fax:  [phone]  Name: MIAISABELLA BACORN MRN: [id number] Date of Birth: 11/29/1956  Karie Mainland, PT 02/07/21 3:16 PM Phone: [phone] Fax: [phone]

## 2021-02-07 NOTE — Patient Instructions (Addendum)
Access Code: [email or web address]    URL: [email or web address]: 02/22/2022Prepared by: Victorino Dike PaaExercises  Circular Shoulder Pendulum with Table Support - 1 x daily - 7 x weekly - 2 sets - 10 reps - 30 hold  Supine Shoulder Flexion Extension AAROM with Dowel - 2 x daily - 7 x weekly - 2 sets - 10 reps - 5 hold  Supine Shoulder External Rotation with Dowel - 2 x daily - 7 x weekly - 2 sets - 10 reps - 5 hold  Standing Bilateral Low Shoulder Row with Anchored Resistance - 2 x daily - 7 x weekly - 2 sets - 10 reps - 5 hold  Single Arm Shoulder Extension with Anchored Resistance - 2 x daily - 7 x weekly - 2 sets - 10 reps - 5 hold  Shoulder External Rotation with Anchored Resistance - 2 x daily - 7 x weekly - 2 sets - 10 reps - 5 hold

## 2021-02-08 ENCOUNTER — Other Ambulatory Visit: Payer: Self-pay | Admitting: Internal Medicine

## 2021-02-08 DIAGNOSIS — Z1231 Encounter for screening mammogram for malignant neoplasm of breast: Secondary | ICD-10-CM

## 2021-02-14 ENCOUNTER — Ambulatory Visit: Payer: Medicare Other | Attending: Orthopedic Surgery

## 2021-02-14 DIAGNOSIS — M25511 Pain in right shoulder: Secondary | ICD-10-CM | POA: Insufficient documentation

## 2021-02-14 DIAGNOSIS — M25611 Stiffness of right shoulder, not elsewhere classified: Secondary | ICD-10-CM | POA: Insufficient documentation

## 2021-02-22 ENCOUNTER — Ambulatory Visit: Payer: Medicare Other | Admitting: Physical Therapy

## 2021-02-24 ENCOUNTER — Ambulatory Visit: Payer: Medicare Other | Admitting: Physical Therapy

## 2021-03-01 ENCOUNTER — Encounter: Payer: Self-pay | Admitting: Physical Therapy

## 2021-03-01 ENCOUNTER — Other Ambulatory Visit: Payer: Self-pay

## 2021-03-01 ENCOUNTER — Ambulatory Visit: Payer: Medicare Other | Admitting: Physical Therapy

## 2021-03-01 DIAGNOSIS — M25611 Stiffness of right shoulder, not elsewhere classified: Secondary | ICD-10-CM | POA: Diagnosis not present

## 2021-03-01 DIAGNOSIS — M25511 Pain in right shoulder: Secondary | ICD-10-CM | POA: Diagnosis present

## 2021-03-01 NOTE — Therapy (Signed)
[place], [place], [address] Phone: [phone]   Fax:  [phone]  Physical Therapy Treatment  Patient Details  Name: Emily Brock MRN: [id number] Date of Birth: 1956-06-07 Referring Provider (PT): Dr Vonna Drafts   Encounter Date: 03/01/2021   PT End of Session - 03/01/21 1200    Visit Number 3    Number of Visits 12    Date for PT Re-Evaluation 03/17/21    Authorization Type MCR/ MCD    PT Start Time 1147    PT Stop Time 1230    PT Time Calculation (min) 43 min           Past Medical History:  Diagnosis Date  . Asthma   . Recurrent sinus infections     Past Surgical History:  Procedure Laterality Date  . BACK SURGERY    . CHOLECYSTECTOMY    . right foot surgery    . SHOULDER ARTHROSCOPY    . SINUS EXPLORATION    . thoracic scapular infusion  2013    There were no vitals filed for this visit.   Subjective Assessment - 03/01/21 1150    Subjective Pulling pain today. 4/10. I had a sinus infection which made my shoulder pain worse. It is better now.    Currently in Pain? Yes    Pain Score 4     Pain Location Shoulder    Pain Orientation Right    Pain Descriptors / Indicators --   pulling   Pain Type Chronic pain;Surgical pain    Aggravating Factors  over doing it    Pain Relieving Factors activity modification              [facility] PT Assessment - 03/01/21 0001      AROM   Right/Left Shoulder Right    Right Shoulder Flexion 150 Degrees    Right Shoulder Internal Rotation --   reaches to lumbar   Right Shoulder External Rotation --   Reach T2   Right Shoulder Horizontal  ADduction --   with pain     PROM   Right Shoulder Flexion 170 Degrees    Right Shoulder Internal Rotation 70 Degrees    Right Shoulder External Rotation 80 Degrees      Strength   Overall Strength Comments right shoulder flexion, IR, ER 4+/5, pain with ER MMT                          [facility] Adult PT Treatment/Exercise - 03/01/21 0001      Shoulder Exercises: Supine   Horizontal ABduction 20 reps    Theraband Level (Shoulder Horizontal ABduction) Level 2 (Red)    Diagonals 20 reps    Theraband Level (Shoulder Diagonals) Level 2 (Red)      Shoulder Exercises: Standing   External Rotation 20 reps    Theraband Level (Shoulder External Rotation) Level 2 (Red)    Internal Rotation 20 reps    Theraband Level (Shoulder Internal Rotation) Level 2 (Red)    Extension 20 reps    Theraband Level (Shoulder Extension) Level 2 (Red)    Row 20 reps    Theraband Level (Shoulder Row) Level 2 (Red)      Shoulder Exercises: ROM/Strengthening   UBE (Upper Arm Bike) L2 forward and retro x 2 minutes                  PT Education - 03/01/21 1228  Education Details HEP    Person(s) Educated Patient    Methods Explanation;Handout    Comprehension Verbalized understanding            PT Short Term Goals - 03/01/21 1401      PT SHORT TERM GOAL #1   Title Patient will increase passive shoulder flexion to 145 degrees and passive ER to 70 degrees    Time 3    Period Weeks    Status Achieved    Target Date 02/26/21      PT SHORT TERM GOAL #2   Title Patient will increase gross right shoulder strength to 4+/5 in available ranges    Period Weeks    Status Partially Met      PT SHORT TERM GOAL #3   Title Patint will be indepdent with basic stretching and strengthening program    Baseline not yet consistent    Period Weeks    Status On-going    Target Date 02/26/21             PT Long Term Goals - 02/05/21 1423      PT LONG TERM GOAL #1   Title Patient will reach overhead with a 2lb object without pain in order to put her dishes away    Time 6    Period Weeks    Status New      PT LONG TERM GOAL #2   Title Patient will demonstrate full functional motion without pin in order to perfrom ADL's with all movements    Time 6    Period Weeks    Status New     Target Date 03/19/21                 Plan - 03/01/21 1201    Clinical Impression Statement Pt arrives reporting she has been sick with a sinus infection and missed two appointments. She did not perfrom HEP while sick. She has been using her RUE for normal function and reports good tolerance without lasting pain or pain the next day. She is in pain today with 4/10 at rest. Reviewed HEP and progressed with more strengthening. She requested additional HEP exercises. Her AROM and PROM are improved with only limitations at IR and horizontal adduction. She tolerated all strengtening without c/o pain during session.    PT Next Visit Plan Progress strength as tolerated,    PT Home Exercise Plan Access Code: [email or web address]           Patient will benefit from skilled therapeutic intervention in order to improve the following deficits and impairments:  Decreased range of motion,Pain,Impaired UE functional use,Decreased strength,Decreased activity tolerance,Decreased endurance  Visit Diagnosis: Stiffness of right shoulder, not elsewhere classified     Problem List Patient Active Problem List   Diagnosis Date Noted  . Obstructive sleep apnea 01/18/2016    Dorene Ar, PTA 03/01/2021, 2:02 PM  [facility] [address] [place], [place], [address] Phone: [phone]   Fax:  [phone]  Name: Emily Brock MRN: [id number] Date of Birth: 12-15-1956

## 2021-03-01 NOTE — Patient Instructions (Signed)
   Side Pull: Double Arm   On back, knees bent, feet flat. Arms perpendicular to body, shoulder level, elbows straight but relaxed. Pull arms out to sides, elbows straight. Resistance band comes across collarbones, hands toward floor. Hold momentarily. Slowly return to starting position. Repeat __15_ times. Band color __R___   Sash   On back, knees bent, feet flat, left hand on left hip, right hand above left. Pull right arm DIAGONALLY (hip to shoulder) across chest. Bring right arm along head toward floor. Hold momentarily. Slowly return to starting position. Repeat __15_ times. Do with left arm. Band color _R_____

## 2021-03-08 ENCOUNTER — Ambulatory Visit: Payer: Medicare Other | Admitting: Physical Therapy

## 2021-03-08 ENCOUNTER — Other Ambulatory Visit: Payer: Self-pay

## 2021-03-08 ENCOUNTER — Encounter: Payer: Self-pay | Admitting: Physical Therapy

## 2021-03-08 DIAGNOSIS — M25611 Stiffness of right shoulder, not elsewhere classified: Secondary | ICD-10-CM | POA: Diagnosis not present

## 2021-03-08 DIAGNOSIS — M25511 Pain in right shoulder: Secondary | ICD-10-CM

## 2021-03-08 NOTE — Therapy (Signed)
[place], [place], [address] Phone: [phone]   Fax:  [phone]  Physical Therapy Treatment  Patient Details  Name: Emily Brock MRN: [id number] Date of Birth: 09/05/1956 Referring Provider (PT): Dr Vonna Drafts   Encounter Date: 03/08/2021   PT End of Session - 03/08/21 1343    Visit Number 4    Number of Visits 12    Date for PT Re-Evaluation 03/17/21    Authorization Type MCR/ MCD    PT Start Time 1149    PT Stop Time 1227    PT Time Calculation (min) 38 min           Past Medical History:  Diagnosis Date  . Asthma   . Recurrent sinus infections     Past Surgical History:  Procedure Laterality Date  . BACK SURGERY    . CHOLECYSTECTOMY    . right foot surgery    . SHOULDER ARTHROSCOPY    . SINUS EXPLORATION    . thoracic scapular infusion  2013    There were no vitals filed for this visit.   Subjective Assessment - 03/08/21 1151    Subjective 5-6/10 pain. I gardened all day yesterday and it did not change my pain. I have been averaging at 5-6/10 in my shoulder.    Currently in Pain? Yes    Pain Score 6     Pain Location Shoulder    Pain Orientation Right    Pain Descriptors / Indicators Sore    Pain Type Chronic pain;Surgical pain    Aggravating Factors  nothing really    Pain Relieving Factors nothing really, just constant                             [facility] Adult PT Treatment/Exercise - 03/08/21 0001      Shoulder Exercises: Supine   Protraction 20 reps    Protraction Weight (lbs) 3    Horizontal ABduction 20 reps    Theraband Level (Shoulder Horizontal ABduction) Level 3 (Green)    Diagonals 20 reps    Theraband Level (Shoulder Diagonals) Level 2 (Red)    Other Supine Exercises chest press x 15 dowel (and for above exercise)   3#     Shoulder Exercises: Standing   External Rotation 20 reps    Theraband Level (Shoulder External Rotation) Level 3  (Green)    Internal Rotation 20 reps    Theraband Level (Shoulder Internal Rotation) Level 3 (Green)    Extension 20 reps    Theraband Level (Shoulder Extension) Level 3 (Green)    Row 20 reps    Theraband Level (Shoulder Row) Level 3 (Green)      Shoulder Exercises: ROM/Strengthening   UBE (Upper Arm Bike) L2 forward and retro x 2 minutes      Shoulder Exercises: Stretch   Internal Rotation Stretch 10 seconds    Internal Rotation Stretch Limitations AAROM   reach behind back                   PT Short Term Goals - 03/08/21 1338      PT SHORT TERM GOAL #1   Title Patient will increase passive shoulder flexion to 145 degrees and passive ER to 70 degrees    Time 3    Period Weeks    Status Achieved    Target Date 02/26/21      PT SHORT TERM  GOAL #2   Title Patient will increase gross right shoulder strength to 4+/5 in available ranges    Time 3    Period Weeks    Status Partially Met    Target Date 02/26/21      PT SHORT TERM GOAL #3   Title Patint will be indepdent with basic stretching and strengthening program    Baseline concsistent with current HEP, progressed band strength today    Time 3    Period Weeks    Status Achieved             PT Long Term Goals - 03/08/21 1339      PT LONG TERM GOAL #1   Title Patient will reach overhead with a 2lb object without pain in order to put her dishes away    Baseline Pt reports no limitation, but does still have pain    Time 6    Period Weeks    Status Partially Met    Target Date 03/19/21      PT LONG TERM GOAL #2   Title Patient will demonstrate full functional motion without pain in order to perfrom ADL's with all movements    Baseline most pain with IR reaching behind back    Time 6    Period Weeks    Status On-going    Target Date 03/18/21                 Plan - 03/08/21 1340    Clinical Impression Statement Pt arrives reporting compliance with HEP and using bands regularly. Able to  progress supine bands from Red to green without increased pain. She reports no limitation with reaching into cabinets to put dishes away however she always has at least 5-6/10 pain. She was able to garden yesterday all day, including pulling weeds and planting seeds without limitaiton. Also, he pain is not increased today, but stayed the same. Her IR AROM is limited so began AAROM using opposite UE to assist. She had no increased pain with session, only discomfort with IR AAROM.    PT Next Visit Plan Progress strength as tolerated, check IR ROM and progress    PT Home Exercise Plan Access Code: [email or web address]           Patient will benefit from skilled therapeutic intervention in order to improve the following deficits and impairments:  Decreased range of motion,Pain,Impaired UE functional use,Decreased strength,Decreased activity tolerance,Decreased endurance  Visit Diagnosis: Stiffness of right shoulder, not elsewhere classified  Acute pain of right shoulder     Problem List Patient Active Problem List   Diagnosis Date Noted  . Obstructive sleep apnea 01/18/2016    Dorene Ar, PTA 03/08/2021, 1:49 PM  [address] [place], [place], [address] Phone: [phone]   Fax:  [phone]  Name: AIRYONNA FRANKLYN MRN: [id number] Date of Birth: 1956/02/17

## 2021-03-16 ENCOUNTER — Ambulatory Visit: Payer: Medicare Other

## 2021-03-22 ENCOUNTER — Encounter: Payer: Self-pay | Admitting: Physical Therapy

## 2021-03-22 ENCOUNTER — Ambulatory Visit: Payer: Medicare Other | Attending: Orthopedic Surgery | Admitting: Physical Therapy

## 2021-03-22 ENCOUNTER — Other Ambulatory Visit: Payer: Self-pay

## 2021-03-22 DIAGNOSIS — M25511 Pain in right shoulder: Secondary | ICD-10-CM | POA: Diagnosis present

## 2021-03-22 DIAGNOSIS — M25611 Stiffness of right shoulder, not elsewhere classified: Secondary | ICD-10-CM | POA: Insufficient documentation

## 2021-03-22 NOTE — Patient Instructions (Signed)
Access Code: [email or web address] URL: [email or web address] Date: 03/22/2021 Prepared by: Lulu Riding  Exercises Circular Shoulder Pendulum with Table Support - 1 x daily - 7 x weekly - 2 sets - 10 reps - 30 hold Supine Shoulder External Rotation with Dowel - 2 x daily - 7 x weekly - 2 sets - 10 reps - 5 hold Standing Bilateral Low Shoulder Row with Anchored Resistance - 2 x daily - 7 x weekly - 2 sets - 10 reps - 5 hold Single Arm Shoulder Extension with Anchored Resistance - 2 x daily - 7 x weekly - 2 sets - 10 reps - 5 hold Shoulder External Rotation with Anchored Resistance - 2 x daily - 7 x weekly - 2 sets - 10 reps - 5 hold Shoulder Internal Rotation - 1 x daily - 7 x weekly - 2 sets - 10 reps Scapular retraction with ER (MONEY) - 1 x daily - 7 x weekly - 2 sets - 10 reps Standing Shoulder Scaption - 1 x daily - 7 x weekly - 2 sets - 10 reps Seated Overhead Press - 1 x daily - 7 x weekly - 2 sets - 10 reps Wall Push Up with Plus - 1 x daily - 7 x weekly - 2 sets - 10 reps - 5 hold

## 2021-03-22 NOTE — Therapy (Signed)
[place], [place], [address] Phone: [phone]   Fax:  [phone]  Physical Therapy Treatment / discharge  Patient Details  Name: Emily Brock MRN: [id number] Date of Birth: [birth year] Referring Provider (PT): Dr Vonna Drafts   Encounter Date: 03/22/2021   PT End of Session - 03/22/21 1143    Visit Number 5    Number of Visits 12    Date for PT Re-Evaluation 03/22/21    Authorization Type MCR/ MCD -    Progress Note Due on Visit 10    PT Start Time 1148    PT Stop Time 1220    PT Time Calculation (min) 32 min    Activity Tolerance Patient tolerated treatment well    Behavior During Therapy [facility] for tasks assessed/performed           Past Medical History:  Diagnosis Date  . Asthma   . Recurrent sinus infections     Past Surgical History:  Procedure Laterality Date  . BACK SURGERY    . CHOLECYSTECTOMY    . right foot surgery    . SHOULDER ARTHROSCOPY    . SINUS EXPLORATION    . thoracic scapular infusion  2013    There were no vitals filed for this visit.   Subjective Assessment - 03/22/21 1151    Subjective " the shoulder is relatively good. I saw the surgeon Monday and he says it is 95% function, my biggest issue is the pain which the doctor says will improve."    Patient Stated Goals to be able to use her arm with less pain    Currently in Pain? Yes    Pain Score 6     Pain Location Shoulder    Pain Orientation Right    Pain Descriptors / Indicators Sore    Pain Type Chronic pain    Pain Frequency Intermittent    Aggravating Factors  Nothing specific    Pain Relieving Factors nothing              [facility] PT Assessment - 03/22/21 0001      Assessment   Medical Diagnosis R Shoulder Lysis of Adhesions    Referring Provider (PT) Dr Vonna Drafts      Observation/Other Assessments   Focus on Therapeutic Outcomes (FOTO)  63% function      Strength   Right/Left Shoulder  Right    Right Shoulder Flexion 4/5    Right Shoulder Extension 5/5    Right Shoulder ABduction 4/5    Right Shoulder Internal Rotation 5/5    Right Shoulder External Rotation 5/5                                 PT Education - 03/22/21 1222    Education Details Reviewed HEP and updated today for additional shoulder strengthening an dhow to properly progrss with reps/ sets and resistance. Reviewed FOTO assessment    Person(s) Educated Patient    Methods Explanation;Verbal cues;Handout    Comprehension Verbalized understanding;Verbal cues required            PT Short Term Goals - 03/22/21 1155      PT SHORT TERM GOAL #1   Title Patient will increase passive shoulder flexion to 145 degrees and passive ER to 70 degrees    Period Weeks    Status Achieved      PT SHORT TERM  GOAL #2   Title Patient will increase gross right shoulder strength to 4+/5 in available ranges    Status Partially Met      PT SHORT TERM GOAL #3   Title Patint will be indepdent with basic stretching and strengthening program    Period Weeks    Status Achieved             PT Long Term Goals - 03/22/21 1202      PT LONG TERM GOAL #1   Title Patient will reach overhead with a 2lb object without pain in order to put her dishes away    Baseline no functional limtaiton continued to have pain.    Period Weeks    Status Partially Met      PT LONG TERM GOAL #2   Title Patient will demonstrate full functional motion without pain in order to perfrom ADL's with all movements                 Plan - 03/22/21 1223    Clinical Impression Statement pt has made excellent progress with physical therapy increasing shoulder ROM and strength. She met or partially met all goals today, and inceased her FOTO score to 63% funciton. she is able to maintain and progress current LOF IND and will be dishcarged from PT today.    PT Treatment/Interventions ADLs/Self Care Home Management;Electrical  Stimulation;Cryotherapy;Ultrasound;DME Instruction;Gait training;Stair training;Therapeutic exercise;Therapeutic activities;Patient/family education;Neuromuscular re-education;Manual techniques;Passive range of motion;Dry needling;Splinting;Taping    PT Next Visit Plan Progress strength as tolerated, check IR ROM and progress    PT Home Exercise Plan Access Code: [email or web address]    Consulted and Agree with Plan of Care Patient           Patient will benefit from skilled therapeutic intervention in order to improve the following deficits and impairments:  Decreased range of motion,Pain,Impaired UE functional use,Decreased strength,Decreased activity tolerance,Decreased endurance  Visit Diagnosis: Stiffness of right shoulder, not elsewhere classified  Acute pain of right shoulder     Problem List Patient Active Problem List   Diagnosis Date Noted  . Obstructive sleep apnea 01/18/2016    Starr [place] 03/22/2021, 12:26 PM  [facility] [address] [place], [place], [address] Phone: [phone]   Fax:  [phone]  Name: Emily Brock MRN: [id number] Date of Birth: 07-11-1956      PHYSICAL THERAPY DISCHARGE SUMMARY  Visits from Start of Care: 5  Current functional level related to goals / functional outcomes: See goals, FOTO 63% function   Remaining deficits: See assessment/ flowsheet   Education / Equipment: HEP, theraband, posture, lifting mechanics  Plan: Patient agrees to discharge.  Patient goals were partially met. Patient is being discharged due to meeting the stated rehab goals.  ?????          PT, DPT, LAT, ATC  03/22/21  12:27 PM

## 2021-04-05 ENCOUNTER — Ambulatory Visit: Payer: Medicare Other

## 2021-04-06 ENCOUNTER — Other Ambulatory Visit: Payer: Self-pay

## 2021-04-06 ENCOUNTER — Ambulatory Visit
Admission: RE | Admit: 2021-04-06 | Discharge: 2021-04-06 | Disposition: A | Discharge status: Home / Self Care | Payer: Medicare Other | Source: Ambulatory Visit | Attending: Internal Medicine | Admitting: Internal Medicine

## 2021-04-06 DIAGNOSIS — Z1231 Encounter for screening mammogram for malignant neoplasm of breast: Secondary | ICD-10-CM

## 2021-10-18 ENCOUNTER — Other Ambulatory Visit: Payer: Self-pay | Admitting: Neurological Surgery

## 2021-11-03 NOTE — Progress Notes (Signed)
Surgical Instructions    Your procedure is scheduled on 11/14/21.  Report to [facility] Main Entrance "A" at 10:40 A.M., then check in with the Admitting office.  Call this number if you have problems the morning of surgery:  [phone]   If you have any questions prior to your surgery date call [phone]: Open Monday-Friday 8am-4pm    Remember:  Do not eat after midnight the night before your surgery  You may drink clear liquids until 9:40 the morning of your surgery.   Clear liquids allowed are: Water, Non-Citrus Juices (without pulp), Carbonated Beverages, Clear Tea, Black Coffee ONLY (NO MILK, CREAM OR POWDERED CREAMER of any kind), and Gatorade    Take these medicines the morning of surgery with A SIP OF WATER:  As Needed: acetaminophen (TYLENOL)  albuterol (PROVENTIL HFA;VENTOLIN HFA) - bring day of surgery oxyCODONE (OXY IR/ROXICODONE) PULMICORT   As of today, STOP taking any Aspirin (unless otherwise instructed by your surgeon) celecoxib (CELEBREX), Aleve, Naproxen, Ibuprofen, Motrin, Advil, Goody's, BC's, all herbal medications, fish oil, and all vitamins.     After your COVID test   You are not required to quarantine however you are required to wear a well-fitting mask when you are out and around people not in your household.  If your mask becomes wet or soiled, replace with a new one.  Wash your hands often with soap and water for 20 seconds or clean your hands with an alcohol-based hand sanitizer that contains at least 60% alcohol.  Do not share personal items.  Notify your provider: if you are in close contact with someone who has COVID  or if you develop a fever of 100.4 or greater, sneezing, cough, sore throat, shortness of breath or body aches.             Do not wear jewelry or makeup Do not wear lotions, powders, perfumes or deodorant. Do not shave 48 hours prior to surgery.   Do not bring valuables to the hospital. DO Not wear nail polish, gel  polish, artificial nails, or any other type of covering on natural nails including finger and toenails. If patients have artificial nails, gel coating, etc. that need to be removed by a nail salon, please have this removed prior to surgery or surgery may need to be canceled/delayed if the surgeon/ anesthesia feels like the patient is unable to be adequately monitored.             [facility] is not responsible for any belongings or valuables.  Do NOT Smoke (Tobacco/Vaping)  24 hours prior to your procedure  If you use a CPAP at night, you may bring your mask for your overnight stay.   Contacts, glasses, hearing aids, dentures or partials may not be worn into surgery, please bring cases for these belongings   For patients admitted to the hospital, discharge time will be determined by your treatment team.   Patients discharged the day of surgery will not be allowed to drive home, and someone needs to stay with them for 24 hours.  NO VISITORS WILL BE ALLOWED IN PRE-OP WHERE PATIENTS ARE PREPPED FOR SURGERY.  ONLY 1 SUPPORT PERSON MAY BE PRESENT IN THE WAITING ROOM WHILE YOU ARE IN SURGERY.  IF YOU ARE TO BE ADMITTED, ONCE YOU ARE IN YOUR ROOM YOU WILL BE ALLOWED TWO (2) VISITORS. 1 (ONE) VISITOR MAY STAY OVERNIGHT BUT MUST ARRIVE TO THE ROOM BY 8pm.  Minor children may have two parents present. Special  consideration for safety and communication needs will be reviewed on a case by case basis.  Special instructions:    Oral Hygiene is also important to reduce your risk of infection.  Remember - BRUSH YOUR TEETH THE MORNING OF SURGERY WITH YOUR REGULAR TOOTHPASTE   [facility]- Preparing For Surgery  Before surgery, you can play an important role. Because skin is not sterile, your skin needs to be as free of germs as possible. You can reduce the number of germs on your skin by washing with CHG (chlorahexidine gluconate) Soap before surgery.  CHG is an antiseptic cleaner which kills germs and bonds  with the skin to continue killing germs even after washing.     Please do not use if you have an allergy to CHG or antibacterial soaps. If your skin becomes reddened/irritated stop using the CHG.  Do not shave (including legs and underarms) for at least 48 hours prior to first CHG shower. It is OK to shave your face.  Please follow these instructions carefully.     Shower the NIGHT BEFORE SURGERY and the MORNING OF SURGERY with CHG Soap.   If you chose to wash your hair, wash your hair first as usual with your normal shampoo. After you shampoo, rinse your hair and body thoroughly to remove the shampoo.  Then [employer] and genitals (private parts) with your normal soap and rinse thoroughly to remove soap.  After that Use CHG Soap as you would any other liquid soap. You can apply CHG directly to the skin and wash gently with a scrungie or a clean washcloth.   Apply the CHG Soap to your body ONLY FROM THE NECK DOWN.  Do not use on open wounds or open sores. Avoid contact with your eyes, ears, mouth and genitals (private parts). Wash Face and genitals (private parts)  with your normal soap.   Wash thoroughly, paying special attention to the area where your surgery will be performed.  Thoroughly rinse your body with warm water from the neck down.  DO NOT shower/wash with your normal soap after using and rinsing off the CHG Soap.  Pat yourself dry with a CLEAN TOWEL.  Wear CLEAN PAJAMAS to bed the night before surgery  Place CLEAN SHEETS on your bed the night before your surgery  DO NOT SLEEP WITH PETS.   Day of Surgery:  Take a shower with CHG soap. Wear Clean/Comfortable clothing the morning of surgery Do not apply any deodorants/lotions.   Remember to brush your teeth WITH YOUR REGULAR TOOTHPASTE.   Please read over the following fact sheets that you were given.

## 2021-11-06 ENCOUNTER — Encounter (HOSPITAL_COMMUNITY)
Admission: RE | Admit: 2021-11-06 | Discharge: 2021-11-06 | Disposition: A | Discharge status: Home / Self Care | Payer: Medicare Other | Source: Ambulatory Visit | Attending: Neurological Surgery | Admitting: Neurological Surgery

## 2021-11-06 ENCOUNTER — Encounter (HOSPITAL_COMMUNITY): Payer: Self-pay

## 2021-11-06 ENCOUNTER — Other Ambulatory Visit: Payer: Self-pay

## 2021-11-06 VITALS — BP 130/76 | HR 111 | Temp 98.0°F | Resp 18 | Ht 63.0 in | Wt 124.0 lb

## 2021-11-06 DIAGNOSIS — D699 Hemorrhagic condition, unspecified: Secondary | ICD-10-CM | POA: Diagnosis not present

## 2021-11-06 DIAGNOSIS — Z01812 Encounter for preprocedural laboratory examination: Secondary | ICD-10-CM | POA: Insufficient documentation

## 2021-11-06 DIAGNOSIS — Z01818 Encounter for other preprocedural examination: Secondary | ICD-10-CM

## 2021-11-06 HISTORY — DX: Other specified postprocedural states: Z98.890

## 2021-11-06 HISTORY — DX: Headache, unspecified: R51.9

## 2021-11-06 HISTORY — DX: Nausea with vomiting, unspecified: R11.2

## 2021-11-06 HISTORY — DX: Anemia, unspecified: D64.9

## 2021-11-06 LAB — SURGICAL PCR SCREEN
MRSA, PCR: NEGATIVE
Staphylococcus aureus: NEGATIVE

## 2021-11-06 LAB — CBC
HCT: 43.9 % (ref 36.0–46.0)
Hemoglobin: 13.8 g/dL (ref 12.0–15.0)
MCH: 29.2 pg (ref 26.0–34.0)
MCHC: 31.4 g/dL (ref 30.0–36.0)
MCV: 92.8 fL (ref 80.0–100.0)
Platelets: 372 10*3/uL (ref 150–400)
RBC: 4.73 MIL/uL (ref 3.87–5.11)
RDW: 12.1 % (ref 11.5–15.5)
WBC: 9.3 10*3/uL (ref 4.0–10.5)
nRBC: 0 % (ref 0.0–0.2)

## 2021-11-06 NOTE — Progress Notes (Signed)
Surgical Instructions    Your procedure is scheduled on Tuesday, November 29.  Report to [facility] Main Entrance "A" at 10:40 A.M., then check in with the Admitting office.  Call this number if you have problems the morning of surgery:  [phone]   If you have any questions prior to your surgery date call [phone]: Open Monday-Friday 8am-4pm    Remember:  Do not eat after midnight the night before your surgery  You may drink clear liquids until 9:40 the morning of your surgery.   Clear liquids allowed are: Water, Non-Citrus Juices (without pulp), Carbonated Beverages, Clear Tea, Black Coffee ONLY (NO MILK, CREAM OR POWDERED CREAMER of any kind), and Gatorade    Take these medicines the morning of surgery with A SIP OF WATER:  As Needed: acetaminophen (TYLENOL)  albuterol (PROVENTIL HFA;VENTOLIN HFA) - bring day of surgery oxyCODONE (OXY IR/ROXICODONE) PULMICORT   As of today, STOP taking any Aspirin (unless otherwise instructed by your surgeon) celecoxib (CELEBREX), Aleve, Naproxen, Ibuprofen, Motrin, Advil, Goody's, BC's, all herbal medications, fish oil, and all vitamins.   DAY OF SURGERY         Do not wear jewelry, makeup, or nail polish Do not wear lotions, powders, perfumes or deodorant. Do not shave 48 hours prior to surgery.   Do not bring valuables to the hospital.             [facility] is not responsible for any belongings or valuables.  Do NOT Smoke (Tobacco/Vaping)  24 hours prior to your procedure  If you use a CPAP at night, you may bring your mask for your overnight stay.   Contacts, glasses, hearing aids, dentures or partials may not be worn into surgery, please bring cases for these belongings   For patients admitted to the hospital, discharge time will be determined by your treatment team.   Patients discharged the day of surgery will not be allowed to drive home, and someone needs to stay with them for 24 hours.  NO VISITORS WILL BE ALLOWED  IN PRE-OP WHERE PATIENTS ARE PREPPED FOR SURGERY.  ONLY 1 SUPPORT PERSON MAY BE PRESENT IN THE WAITING ROOM WHILE YOU ARE IN SURGERY.  IF YOU ARE TO BE ADMITTED, ONCE YOU ARE IN YOUR ROOM YOU WILL BE ALLOWED TWO (2) VISITORS. 1 (ONE) VISITOR MAY STAY OVERNIGHT BUT MUST ARRIVE TO THE ROOM BY 8pm.  Minor children may have two parents present. Special consideration for safety and communication needs will be reviewed on a case by case basis.  Special instructions:    Oral Hygiene is also important to reduce your risk of infection.  Remember - BRUSH YOUR TEETH THE MORNING OF SURGERY WITH YOUR REGULAR TOOTHPASTE   [facility]- Preparing For Surgery  Before surgery, you can play an important role. Because skin is not sterile, your skin needs to be as free of germs as possible. You can reduce the number of germs on your skin by washing with CHG (chlorahexidine gluconate) Soap before surgery.  CHG is an antiseptic cleaner which kills germs and bonds with the skin to continue killing germs even after washing.     Please do not use if you have an allergy to CHG or antibacterial soaps. If your skin becomes reddened/irritated stop using the CHG.  Do not shave (including legs and underarms) for at least 48 hours prior to first CHG shower. It is OK to shave your face.  Please follow these instructions carefully.  Shower the NIGHT BEFORE SURGERY and the MORNING OF SURGERY with CHG Soap.   If you chose to wash your hair, wash your hair first as usual with your normal shampoo. After you shampoo, rinse your hair and body thoroughly to remove the shampoo.  Then [employer] and genitals (private parts) with your normal soap and rinse thoroughly to remove soap.  After that Use CHG Soap as you would any other liquid soap. You can apply CHG directly to the skin and wash gently with a scrungie or a clean washcloth.   Apply the CHG Soap to your body ONLY FROM THE NECK DOWN.  Do not use on open wounds or open sores.  Avoid contact with your eyes, ears, mouth and genitals (private parts). Wash Face and genitals (private parts)  with your normal soap.   Wash thoroughly, paying special attention to the area where your surgery will be performed.  Thoroughly rinse your body with warm water from the neck down.  DO NOT shower/wash with your normal soap after using and rinsing off the CHG Soap.  Pat yourself dry with a CLEAN TOWEL.  Wear CLEAN PAJAMAS to bed the night before surgery  Place CLEAN SHEETS on your bed the night before your surgery  DO NOT SLEEP WITH PETS.   Day of Surgery:  Take a shower with CHG soap. Wear Clean/Comfortable clothing the morning of surgery Do not apply any deodorants/lotions.   Remember to brush your teeth WITH YOUR REGULAR TOOTHPASTE.   Please read over the following fact sheets that you were given.

## 2021-11-06 NOTE — Progress Notes (Signed)
PCP - Dr. Lovie Chol [facility] Cardiologist - denies  Chest x-ray - n/a EKG - n/a  SA - yes, wears CPAP   ERAS Protcol - yes, no drink ordered or given   COVID TEST- 11/13/21 (surgery admit)   Anesthesia review: n/a  Patient denies shortness of breath, fever, cough and chest pain at PAT appointment   All instructions explained to the patient, with a verbal understanding of the material. Patient agrees to go over the instructions while at home for a better understanding. Patient also instructed to self quarantine after being tested for COVID-19. The opportunity to ask questions was provided.

## 2021-11-07 ENCOUNTER — Other Ambulatory Visit (HOSPITAL_COMMUNITY): Payer: Medicare Other

## 2021-11-13 ENCOUNTER — Other Ambulatory Visit (HOSPITAL_COMMUNITY)
Admission: RE | Admit: 2021-11-13 | Discharge: 2021-11-13 | Disposition: A | Discharge status: Home / Self Care | Payer: Medicare Other | Source: Ambulatory Visit | Attending: Neurological Surgery | Admitting: Neurological Surgery

## 2021-11-13 DIAGNOSIS — Z01818 Encounter for other preprocedural examination: Secondary | ICD-10-CM

## 2021-11-13 DIAGNOSIS — Z20822 Contact with and (suspected) exposure to covid-19: Secondary | ICD-10-CM | POA: Insufficient documentation

## 2021-11-13 DIAGNOSIS — Z01812 Encounter for preprocedural laboratory examination: Secondary | ICD-10-CM | POA: Insufficient documentation

## 2021-11-13 LAB — SARS CORONAVIRUS 2 (TAT 6-24 HRS): SARS Coronavirus 2: NEGATIVE

## 2021-11-14 ENCOUNTER — Encounter (HOSPITAL_COMMUNITY): Payer: Self-pay | Admitting: Neurological Surgery

## 2021-11-14 ENCOUNTER — Other Ambulatory Visit: Payer: Self-pay

## 2021-11-14 ENCOUNTER — Inpatient Hospital Stay (HOSPITAL_COMMUNITY): Payer: Medicare Other

## 2021-11-14 ENCOUNTER — Inpatient Hospital Stay (HOSPITAL_COMMUNITY)
Admission: RE | Disposition: A | Discharge status: Home / Self Care | Payer: Self-pay | Source: Home / Self Care | Attending: Neurological Surgery

## 2021-11-14 ENCOUNTER — Inpatient Hospital Stay (HOSPITAL_COMMUNITY)
Admission: RE | Admit: 2021-11-14 | Discharge: 2021-11-15 | DRG: 455 | Disposition: A | Discharge status: Home / Self Care | Payer: Medicare Other | Attending: Neurological Surgery | Admitting: Neurological Surgery

## 2021-11-14 DIAGNOSIS — G4730 Sleep apnea, unspecified: Secondary | ICD-10-CM | POA: Diagnosis present

## 2021-11-14 DIAGNOSIS — Z419 Encounter for procedure for purposes other than remedying health state, unspecified: Secondary | ICD-10-CM

## 2021-11-14 DIAGNOSIS — M48062 Spinal stenosis, lumbar region with neurogenic claudication: Secondary | ICD-10-CM | POA: Diagnosis present

## 2021-11-14 DIAGNOSIS — Z881 Allergy status to other antibiotic agents status: Secondary | ICD-10-CM

## 2021-11-14 DIAGNOSIS — M5416 Radiculopathy, lumbar region: Secondary | ICD-10-CM | POA: Diagnosis present

## 2021-11-14 DIAGNOSIS — Z20822 Contact with and (suspected) exposure to covid-19: Secondary | ICD-10-CM | POA: Diagnosis present

## 2021-11-14 DIAGNOSIS — Z886 Allergy status to analgesic agent status: Secondary | ICD-10-CM | POA: Diagnosis not present

## 2021-11-14 DIAGNOSIS — M4316 Spondylolisthesis, lumbar region: Principal | ICD-10-CM | POA: Diagnosis present

## 2021-11-14 DIAGNOSIS — M4807 Spinal stenosis, lumbosacral region: Secondary | ICD-10-CM | POA: Diagnosis present

## 2021-11-14 LAB — TYPE AND SCREEN
ABO/RH(D): O POS
Antibody Screen: POSITIVE

## 2021-11-14 SURGERY — POSTERIOR LUMBAR FUSION 2 LEVEL
Anesthesia: General | Site: Back

## 2021-11-14 MED ORDER — DEXAMETHASONE SODIUM PHOSPHATE 10 MG/ML IJ SOLN
INTRAMUSCULAR | Status: AC
  Filled 2021-11-14: qty 1

## 2021-11-14 MED ORDER — FENTANYL CITRATE PF 50 MCG/ML IJ SOSY
50.0000 ug | PREFILLED_SYRINGE | INTRAMUSCULAR | Status: DC | PRN
  Administered 2021-11-14: 50 ug via INTRAVENOUS

## 2021-11-14 MED ORDER — SODIUM CHLORIDE 0.9% FLUSH
3.0000 mL | Freq: Two times a day (BID) | INTRAVENOUS | Status: DC
  Administered 2021-11-14: 3 mL via INTRAVENOUS

## 2021-11-14 MED ORDER — MENTHOL 3 MG MT LOZG
1.0000 | LOZENGE | OROMUCOSAL | Status: DC | PRN
  Filled 2021-11-14: qty 9

## 2021-11-14 MED ORDER — 0.9 % SODIUM CHLORIDE (POUR BTL) OPTIME
TOPICAL | Status: DC | PRN
  Administered 2021-11-14: 1000 mL

## 2021-11-14 MED ORDER — LIDOCAINE-EPINEPHRINE 1 %-1:100000 IJ SOLN
INTRAMUSCULAR | Status: DC | PRN
  Administered 2021-11-14: 5 mL

## 2021-11-14 MED ORDER — THROMBIN 5000 UNITS EX SOLR
CUTANEOUS | Status: AC
  Filled 2021-11-14: qty 5000

## 2021-11-14 MED ORDER — CEFAZOLIN SODIUM-DEXTROSE 2-4 GM/100ML-% IV SOLN
2.0000 g | Freq: Three times a day (TID) | INTRAVENOUS | Status: DC
  Administered 2021-11-15: 2 g via INTRAVENOUS
  Filled 2021-11-14 (×2): qty 100

## 2021-11-14 MED ORDER — FENTANYL CITRATE (PF) 250 MCG/5ML IJ SOLN
INTRAMUSCULAR | Status: AC
  Filled 2021-11-14: qty 5

## 2021-11-14 MED ORDER — ROCURONIUM BROMIDE 10 MG/ML (PF) SYRINGE
PREFILLED_SYRINGE | INTRAVENOUS | Status: DC | PRN
  Administered 2021-11-14: 20 mg via INTRAVENOUS
  Administered 2021-11-14: 30 mg via INTRAVENOUS
  Administered 2021-11-14: 100 mg via INTRAVENOUS
  Administered 2021-11-14: 60 mg via INTRAVENOUS
  Administered 2021-11-14: 40 mg via INTRAVENOUS
  Administered 2021-11-14: 20 mg via INTRAVENOUS

## 2021-11-14 MED ORDER — SODIUM CHLORIDE 0.9% FLUSH: 3.0000 mL | INTRAVENOUS | Status: DC | PRN

## 2021-11-14 MED ORDER — MIDAZOLAM HCL 2 MG/2ML IJ SOLN
INTRAMUSCULAR | Status: DC | PRN
  Administered 2021-11-14: 2 mg via INTRAVENOUS

## 2021-11-14 MED ORDER — ALBUMIN HUMAN 5 % IV SOLN: INTRAVENOUS | Status: DC | PRN

## 2021-11-14 MED ORDER — MIDAZOLAM HCL 2 MG/2ML IJ SOLN
INTRAMUSCULAR | Status: AC
  Filled 2021-11-14: qty 2

## 2021-11-14 MED ORDER — ALUM & MAG HYDROXIDE-SIMETH 200-200-20 MG/5ML PO SUSP: 30.0000 mL | Freq: Four times a day (QID) | ORAL | Status: DC | PRN

## 2021-11-14 MED ORDER — ACETAMINOPHEN 10 MG/ML IV SOLN
INTRAVENOUS | Status: DC | PRN
  Administered 2021-11-14: 1000 mg via INTRAVENOUS

## 2021-11-14 MED ORDER — ONDANSETRON HCL 4 MG PO TABS: 4.0000 mg | ORAL_TABLET | Freq: Four times a day (QID) | ORAL | Status: DC | PRN

## 2021-11-14 MED ORDER — HYDROMORPHONE HCL 1 MG/ML IJ SOLN: 0.2500 mg | INTRAMUSCULAR | Status: DC | PRN

## 2021-11-14 MED ORDER — PROPOFOL 10 MG/ML IV BOLUS
INTRAVENOUS | Status: DC | PRN
  Administered 2021-11-14 (×2): 50 mg via INTRAVENOUS
  Administered 2021-11-14: 140 mg via INTRAVENOUS
  Administered 2021-11-14: 60 mg via INTRAVENOUS

## 2021-11-14 MED ORDER — DOCUSATE SODIUM 100 MG PO CAPS
100.0000 mg | ORAL_CAPSULE | Freq: Two times a day (BID) | ORAL | Status: DC
  Administered 2021-11-14: 100 mg via ORAL

## 2021-11-14 MED ORDER — DEXAMETHASONE SODIUM PHOSPHATE 10 MG/ML IJ SOLN
INTRAMUSCULAR | Status: DC | PRN
  Administered 2021-11-14: 10 mg via INTRAVENOUS

## 2021-11-14 MED ORDER — SUGAMMADEX SODIUM 200 MG/2ML IV SOLN
INTRAVENOUS | Status: DC | PRN
  Administered 2021-11-14 (×2): 200 mg via INTRAVENOUS

## 2021-11-14 MED ORDER — BISACODYL 10 MG RE SUPP: 10.0000 mg | Freq: Every day | RECTAL | Status: DC | PRN

## 2021-11-14 MED ORDER — CHLORHEXIDINE GLUCONATE CLOTH 2 % EX PADS: 6.0000 | MEDICATED_PAD | Freq: Once | CUTANEOUS | Status: DC

## 2021-11-14 MED ORDER — FENTANYL CITRATE (PF) 100 MCG/2ML IJ SOLN
25.0000 ug | INTRAMUSCULAR | Status: DC | PRN
  Administered 2021-11-14: 25 ug via INTRAVENOUS
  Administered 2021-11-14: 50 ug via INTRAVENOUS
  Administered 2021-11-14: 25 ug via INTRAVENOUS

## 2021-11-14 MED ORDER — KETAMINE HCL 50 MG/5ML IJ SOSY
PREFILLED_SYRINGE | INTRAMUSCULAR | Status: AC
  Filled 2021-11-14: qty 5

## 2021-11-14 MED ORDER — PHENOL 1.4 % MT LIQD: 1.0000 | OROMUCOSAL | Status: DC | PRN

## 2021-11-14 MED ORDER — OXYCODONE HCL 5 MG PO TABS: 5.0000 mg | ORAL_TABLET | Freq: Once | ORAL | Status: DC | PRN

## 2021-11-14 MED ORDER — KETAMINE HCL 10 MG/ML IJ SOLN
INTRAMUSCULAR | Status: DC | PRN
  Administered 2021-11-14: 20 mg via INTRAVENOUS
  Administered 2021-11-14: 30 mg via INTRAVENOUS
  Administered 2021-11-14: 25 mg via INTRAVENOUS

## 2021-11-14 MED ORDER — LACTATED RINGERS IV SOLN: INTRAVENOUS | Status: DC

## 2021-11-14 MED ORDER — ONDANSETRON HCL 4 MG/2ML IJ SOLN: 4.0000 mg | Freq: Four times a day (QID) | INTRAMUSCULAR | Status: DC | PRN

## 2021-11-14 MED ORDER — PHENYLEPHRINE 40 MCG/ML (10ML) SYRINGE FOR IV PUSH (FOR BLOOD PRESSURE SUPPORT)
PREFILLED_SYRINGE | INTRAVENOUS | Status: DC | PRN
  Administered 2021-11-14: 80 ug via INTRAVENOUS
  Administered 2021-11-14 (×2): 200 ug via INTRAVENOUS

## 2021-11-14 MED ORDER — PHENYLEPHRINE 40 MCG/ML (10ML) SYRINGE FOR IV PUSH (FOR BLOOD PRESSURE SUPPORT)
PREFILLED_SYRINGE | INTRAVENOUS | Status: AC
  Filled 2021-11-14: qty 10

## 2021-11-14 MED ORDER — METHOCARBAMOL 500 MG PO TABS
500.0000 mg | ORAL_TABLET | Freq: Four times a day (QID) | ORAL | Status: DC | PRN
  Administered 2021-11-14: 500 mg via ORAL

## 2021-11-14 MED ORDER — SODIUM CHLORIDE 0.9 % IV SOLN: 250.0000 mL | INTRAVENOUS | Status: DC

## 2021-11-14 MED ORDER — PHENYLEPHRINE HCL-NACL 20-0.9 MG/250ML-% IV SOLN
INTRAVENOUS | Status: DC | PRN
  Administered 2021-11-14: 50 ug/min via INTRAVENOUS

## 2021-11-14 MED ORDER — FENTANYL CITRATE (PF) 100 MCG/2ML IJ SOLN
INTRAMUSCULAR | Status: AC
  Filled 2021-11-14: qty 2

## 2021-11-14 MED ORDER — CEFAZOLIN SODIUM-DEXTROSE 2-4 GM/100ML-% IV SOLN
2.0000 g | INTRAVENOUS | Status: AC
  Administered 2021-11-14 (×2): 2 g via INTRAVENOUS

## 2021-11-14 MED ORDER — SENNA 8.6 MG PO TABS
1.0000 | ORAL_TABLET | Freq: Two times a day (BID) | ORAL | Status: DC
  Administered 2021-11-14: 8.6 mg via ORAL

## 2021-11-14 MED ORDER — FLEET ENEMA 7-19 GM/118ML RE ENEM: 1.0000 | ENEMA | Freq: Once | RECTAL | Status: DC | PRN

## 2021-11-14 MED ORDER — ROCURONIUM BROMIDE 10 MG/ML (PF) SYRINGE
PREFILLED_SYRINGE | INTRAVENOUS | Status: AC
  Filled 2021-11-14: qty 30

## 2021-11-14 MED ORDER — LIDOCAINE 2% (20 MG/ML) 5 ML SYRINGE
INTRAMUSCULAR | Status: DC | PRN
  Administered 2021-11-14: 60 mg via INTRAVENOUS

## 2021-11-14 MED ORDER — BUPIVACAINE HCL (PF) 0.5 % IJ SOLN
INTRAMUSCULAR | Status: AC
  Filled 2021-11-14: qty 30

## 2021-11-14 MED ORDER — ACETAMINOPHEN 325 MG PO TABS: 650.0000 mg | ORAL_TABLET | ORAL | Status: DC | PRN

## 2021-11-14 MED ORDER — ACETAMINOPHEN 160 MG/5ML PO SOLN: 1000.0000 mg | Freq: Once | ORAL | Status: DC | PRN

## 2021-11-14 MED ORDER — LIDOCAINE-EPINEPHRINE 1 %-1:100000 IJ SOLN
INTRAMUSCULAR | Status: AC
  Filled 2021-11-14: qty 1

## 2021-11-14 MED ORDER — ALBUMIN HUMAN 5 % IV SOLN
12.5000 g | Freq: Once | INTRAVENOUS | Status: AC
  Administered 2021-11-14: 12.5 g via INTRAVENOUS

## 2021-11-14 MED ORDER — ACETAMINOPHEN 650 MG RE SUPP: 650.0000 mg | RECTAL | Status: DC | PRN

## 2021-11-14 MED ORDER — BUDESONIDE 0.5 MG/2ML IN SUSP: 0.5000 mg | Freq: Every day | RESPIRATORY_TRACT | Status: DC | PRN

## 2021-11-14 MED ORDER — MORPHINE SULFATE (PF) 2 MG/ML IV SOLN
2.0000 mg | INTRAVENOUS | Status: DC | PRN
  Administered 2021-11-14: 2 mg via INTRAVENOUS
  Administered 2021-11-15: 4 mg via INTRAVENOUS

## 2021-11-14 MED ORDER — PROPOFOL 500 MG/50ML IV EMUL
INTRAVENOUS | Status: DC | PRN
  Administered 2021-11-14: 200 ug/kg/min via INTRAVENOUS
  Administered 2021-11-14: 250 ug/kg/min via INTRAVENOUS

## 2021-11-14 MED ORDER — ONDANSETRON HCL 4 MG/2ML IJ SOLN
INTRAMUSCULAR | Status: DC | PRN
  Administered 2021-11-14 (×2): 4 mg via INTRAVENOUS

## 2021-11-14 MED ORDER — OXYCODONE HCL 5 MG/5ML PO SOLN: 5.0000 mg | Freq: Once | ORAL | Status: DC | PRN

## 2021-11-14 MED ORDER — ONDANSETRON HCL 4 MG/2ML IJ SOLN
INTRAMUSCULAR | Status: AC
  Filled 2021-11-14: qty 2

## 2021-11-14 MED ORDER — CHLORHEXIDINE GLUCONATE 0.12 % MT SOLN
15.0000 mL | Freq: Once | OROMUCOSAL | Status: AC
  Administered 2021-11-14: 15 mL via OROMUCOSAL

## 2021-11-14 MED ORDER — ALBUMIN HUMAN 5 % IV SOLN
INTRAVENOUS | Status: AC
  Filled 2021-11-14: qty 250

## 2021-11-14 MED ORDER — OXYCODONE-ACETAMINOPHEN 5-325 MG PO TABS
1.0000 | ORAL_TABLET | ORAL | Status: DC | PRN
Start: 2021-11-14 — End: 2021-11-15
  Administered 2021-11-15: 1 via ORAL
  Administered 2021-11-15 (×3): 2 via ORAL
  Filled 2021-11-14 (×2): qty 2

## 2021-11-14 MED ORDER — THROMBIN 5000 UNITS EX SOLR
OROMUCOSAL | Status: DC | PRN
  Administered 2021-11-14: 5 mL via TOPICAL

## 2021-11-14 MED ORDER — MELATONIN 3 MG PO TABS
3.0000 mg | ORAL_TABLET | Freq: Every evening | ORAL | Status: DC | PRN
  Administered 2021-11-15: 3 mg via ORAL

## 2021-11-14 MED ORDER — CEFAZOLIN SODIUM-DEXTROSE 2-4 GM/100ML-% IV SOLN
INTRAVENOUS | Status: AC
  Filled 2021-11-14: qty 100

## 2021-11-14 MED ORDER — ROCURONIUM BROMIDE 10 MG/ML (PF) SYRINGE
PREFILLED_SYRINGE | INTRAVENOUS | Status: AC
  Filled 2021-11-14: qty 10

## 2021-11-14 MED ORDER — ACETAMINOPHEN 500 MG PO TABS: 1000.0000 mg | ORAL_TABLET | Freq: Once | ORAL | Status: DC | PRN

## 2021-11-14 MED ORDER — LIDOCAINE 2% (20 MG/ML) 5 ML SYRINGE
INTRAMUSCULAR | Status: AC
  Filled 2021-11-14: qty 5

## 2021-11-14 MED ORDER — ORAL CARE MOUTH RINSE: 15.0000 mL | Freq: Once | OROMUCOSAL | Status: AC

## 2021-11-14 MED ORDER — POLYETHYLENE GLYCOL 3350 17 G PO PACK: 17.0000 g | PACK | Freq: Every day | ORAL | Status: DC | PRN

## 2021-11-14 MED ORDER — CHLORHEXIDINE GLUCONATE 0.12 % MT SOLN
OROMUCOSAL | Status: AC
  Filled 2021-11-14: qty 15

## 2021-11-14 MED ORDER — PANTOPRAZOLE SODIUM 40 MG PO TBEC
40.0000 mg | DELAYED_RELEASE_TABLET | Freq: Every day | ORAL | Status: DC
  Administered 2021-11-15: 40 mg via ORAL
  Filled 2021-11-14: qty 1

## 2021-11-14 MED ORDER — OXYCODONE HCL 5 MG PO TABS
5.0000 mg | ORAL_TABLET | Freq: Three times a day (TID) | ORAL | Status: DC | PRN
  Administered 2021-11-14: 5 mg via ORAL

## 2021-11-14 MED ORDER — THROMBIN 20000 UNITS EX SOLR: CUTANEOUS | Status: DC | PRN

## 2021-11-14 MED ORDER — THROMBIN 20000 UNITS EX SOLR
CUTANEOUS | Status: AC
  Filled 2021-11-14: qty 20000

## 2021-11-14 MED ORDER — POLYETHYLENE GLYCOL 3350 17 GM/SCOOP PO POWD: 1.0000 | ORAL | Status: DC | PRN

## 2021-11-14 MED ORDER — ACETAMINOPHEN 10 MG/ML IV SOLN: 1000.0000 mg | Freq: Once | INTRAVENOUS | Status: DC | PRN

## 2021-11-14 MED ORDER — BUPIVACAINE HCL (PF) 0.5 % IJ SOLN
INTRAMUSCULAR | Status: DC | PRN
  Administered 2021-11-14: 4 mL
  Administered 2021-11-14: 5 mL
  Administered 2021-11-14: 20 mL

## 2021-11-14 MED ORDER — ALBUTEROL SULFATE (2.5 MG/3ML) 0.083% IN NEBU: 3.0000 mL | INHALATION_SOLUTION | Freq: Four times a day (QID) | RESPIRATORY_TRACT | Status: DC | PRN

## 2021-11-14 MED ORDER — ACETAMINOPHEN 10 MG/ML IV SOLN
INTRAVENOUS | Status: AC
  Filled 2021-11-14: qty 100

## 2021-11-14 MED ORDER — METHOCARBAMOL 1000 MG/10ML IJ SOLN
500.0000 mg | Freq: Four times a day (QID) | INTRAVENOUS | Status: DC | PRN
  Filled 2021-11-14: qty 5

## 2021-11-14 MED ORDER — DEXMEDETOMIDINE (PRECEDEX) IN NS 20 MCG/5ML (4 MCG/ML) IV SYRINGE
PREFILLED_SYRINGE | INTRAVENOUS | Status: DC | PRN
  Administered 2021-11-14: 8 ug via INTRAVENOUS

## 2021-11-14 MED ORDER — FENTANYL CITRATE (PF) 250 MCG/5ML IJ SOLN
INTRAMUSCULAR | Status: DC | PRN
  Administered 2021-11-14: 100 ug via INTRAVENOUS
  Administered 2021-11-14 (×2): 50 ug via INTRAVENOUS
  Administered 2021-11-14: 100 ug via INTRAVENOUS
  Administered 2021-11-14 (×2): 50 ug via INTRAVENOUS
  Administered 2021-11-14: 100 ug via INTRAVENOUS
  Administered 2021-11-14 (×2): 50 ug via INTRAVENOUS

## 2021-11-14 SURGICAL SUPPLY — 76 items
ADH SKN CLS APL DERMABOND .7 (GAUZE/BANDAGES/DRESSINGS) ×1
APL SRG 60D 8 XTD TIP BNDBL (TIP)
BAG COUNTER SPONGE SURGICOUNT (BAG) ×2 IMPLANT
BAG SPNG CNTER NS LX DISP (BAG) ×1
BASKET BONE COLLECTION (BASKET) ×2 IMPLANT
BLADE BN FN 3.2XSTRL LF (MISCELLANEOUS) IMPLANT
BLADE BONE MILL FINE (MISCELLANEOUS) ×2
BLADE CLIPPER SURG (BLADE) IMPLANT
BONE CANC CHIPS 40CC CAN1/2 (Bone Implant) ×2 IMPLANT
BUR MATCHSTICK NEURO 3.0 LAGG (BURR) ×2 IMPLANT
CAGE COROENT MP 8X9X23M-8 SPIN (Cage) ×2 IMPLANT
CAGE PLIF 8X9X23-12 LUMBAR (Cage) ×2 IMPLANT
CANISTER SUCT 3000ML PPV (MISCELLANEOUS) ×2 IMPLANT
CHIPS CANC BONE 40CC CAN1/2 (Bone Implant) ×1 IMPLANT
CNTNR URN SCR LID CUP LEK RST (MISCELLANEOUS) ×1 IMPLANT
CONT SPEC 4OZ STRL OR WHT (MISCELLANEOUS) ×2
COVER BACK TABLE 60X90IN (DRAPES) ×2 IMPLANT
DECANTER SPIKE VIAL GLASS SM (MISCELLANEOUS) ×1 IMPLANT
DERMABOND ADVANCED (GAUZE/BANDAGES/DRESSINGS) ×1
DERMABOND ADVANCED .7 DNX12 (GAUZE/BANDAGES/DRESSINGS) ×1 IMPLANT
DEVICE DISSECT PLASMABLAD 3.0S (MISCELLANEOUS) ×1 IMPLANT
DRAPE C-ARM 42X72 X-RAY (DRAPES) ×4 IMPLANT
DRAPE HALF SHEET 40X57 (DRAPES) ×1 IMPLANT
DRAPE LAPAROTOMY 100X72X124 (DRAPES) ×2 IMPLANT
DRSG OPSITE POSTOP 4X6 (GAUZE/BANDAGES/DRESSINGS) ×1 IMPLANT
DURAPREP 26ML APPLICATOR (WOUND CARE) ×2 IMPLANT
DURASEAL APPLICATOR TIP (TIP) IMPLANT
DURASEAL SPINE SEALANT 3ML (MISCELLANEOUS) IMPLANT
ELECT REM PT RETURN 9FT ADLT (ELECTROSURGICAL) ×2
ELECTRODE REM PT RTRN 9FT ADLT (ELECTROSURGICAL) ×1 IMPLANT
GAUZE 4X4 16PLY ~~LOC~~+RFID DBL (SPONGE) ×1 IMPLANT
GAUZE SPONGE 4X4 12PLY STRL (GAUZE/BANDAGES/DRESSINGS) ×1 IMPLANT
GLOVE SURG LTX SZ8.5 (GLOVE) ×4 IMPLANT
GLOVE SURG UNDER POLY LF SZ8.5 (GLOVE) ×4 IMPLANT
GOWN STRL REUS W/ TWL LRG LVL3 (GOWN DISPOSABLE) IMPLANT
GOWN STRL REUS W/ TWL XL LVL3 (GOWN DISPOSABLE) IMPLANT
GOWN STRL REUS W/TWL 2XL LVL3 (GOWN DISPOSABLE) ×4 IMPLANT
GOWN STRL REUS W/TWL LRG LVL3 (GOWN DISPOSABLE) ×6
GOWN STRL REUS W/TWL XL LVL3 (GOWN DISPOSABLE) ×2
GRAFT BNE CHIP CANC 1-8 40 (Bone Implant) IMPLANT
GRAFT BONE PROTEIOS LRG 5CC (Orthopedic Implant) ×1 IMPLANT
GRAFT DURAGEN MATRIX 1WX1L (Tissue) ×1 IMPLANT
HEMOSTAT POWDER KIT SURGIFOAM (HEMOSTASIS) ×2 IMPLANT
KIT BASIN OR (CUSTOM PROCEDURE TRAY) ×2 IMPLANT
KIT GRAFTMAG DEL NEURO DISP (NEUROSURGERY SUPPLIES) ×2 IMPLANT
KIT TURNOVER KIT B (KITS) ×2 IMPLANT
MILL MEDIUM DISP (BLADE) ×1 IMPLANT
NDL SPNL 18GX3.5 QUINCKE PK (NEEDLE) IMPLANT
NEEDLE HYPO 22GX1.5 SAFETY (NEEDLE) ×2 IMPLANT
NEEDLE SPNL 18GX3.5 QUINCKE PK (NEEDLE) ×2 IMPLANT
NS IRRIG 1000ML POUR BTL (IV SOLUTION) ×2 IMPLANT
PACK LAMINECTOMY NEURO (CUSTOM PROCEDURE TRAY) ×2 IMPLANT
PAD ARMBOARD 7.5X6 YLW CONV (MISCELLANEOUS) ×6 IMPLANT
PATTIES SURGICAL .5 X.5 (GAUZE/BANDAGES/DRESSINGS) ×1 IMPLANT
PATTIES SURGICAL .5 X1 (DISPOSABLE) ×3 IMPLANT
PATTIES SURGICAL .5X1.5 (GAUZE/BANDAGES/DRESSINGS) IMPLANT
PATTIES SURGICAL 1X1 (DISPOSABLE) ×1 IMPLANT
PLASMABLADE 3.0S (MISCELLANEOUS) ×2
ROD RELIN-O LORD 5.5X65MM (Rod) ×2 IMPLANT
SCREW LOCK RELINE 5.5 TULIP (Screw) ×6 IMPLANT
SCREW RELINE-O POLY 6.5X40 (Screw) ×2 IMPLANT
SCREW RELINE-O POLY 6.5X45 (Screw) ×4 IMPLANT
SPONGE SURGIFOAM ABS GEL 100 (HEMOSTASIS) ×2 IMPLANT
SPONGE T-LAP 4X18 ~~LOC~~+RFID (SPONGE) ×2 IMPLANT
SUT PROLENE 6 0 BV (SUTURE) ×1 IMPLANT
SUT VIC AB 1 CT1 18XBRD ANBCTR (SUTURE) ×1 IMPLANT
SUT VIC AB 1 CT1 8-18 (SUTURE) ×2
SUT VIC AB 2-0 CP2 18 (SUTURE) ×2 IMPLANT
SUT VIC AB 3-0 SH 8-18 (SUTURE) ×2 IMPLANT
SUT VIC AB 4-0 RB1 18 (SUTURE) ×2 IMPLANT
SYR 3ML LL SCALE MARK (SYRINGE) ×8 IMPLANT
SYR 5ML LL (SYRINGE) ×1 IMPLANT
TOWEL GREEN STERILE (TOWEL DISPOSABLE) ×2 IMPLANT
TOWEL GREEN STERILE FF (TOWEL DISPOSABLE) ×2 IMPLANT
TRAY FOLEY MTR SLVR 16FR STAT (SET/KITS/TRAYS/PACK) ×2 IMPLANT
WATER STERILE IRR 1000ML POUR (IV SOLUTION) ×2 IMPLANT

## 2021-11-14 NOTE — Anesthesia Preprocedure Evaluation (Signed)
Anesthesia Evaluation  Patient identified by MRN, date of birth, ID band Patient awake    Reviewed: Allergy & Precautions, NPO status , Patient's Chart, lab work & pertinent test results  History of Anesthesia Complications (+) PONV and history of anesthetic complications  Airway Mallampati: I  TM Distance: >3 FB Neck ROM: Full    Dental  (+) Dental Advisory Given, Teeth Intact   Pulmonary neg shortness of breath, sleep apnea and Continuous Positive Airway Pressure Ventilation , neg COPD, neg recent URI,    breath sounds clear to auscultation       Cardiovascular negative cardio ROS   Rhythm:Regular     Neuro/Psych  Headaches, negative psych ROS   GI/Hepatic negative GI ROS, Neg liver ROS,   Endo/Other  negative endocrine ROS  Renal/GU negative Renal ROS     Musculoskeletal negative musculoskeletal ROS (+)   Abdominal   Peds  Hematology negative hematology ROS (+) Lab Results      Component                Value               Date                      WBC                      9.3                 11/06/2021                HGB                      13.8                11/06/2021                HCT                      43.9                11/06/2021                MCV                      92.8                11/06/2021                PLT                      372                 11/06/2021              Anesthesia Other Findings   Reproductive/Obstetrics                             Anesthesia Physical Anesthesia Plan  ASA: 1  Anesthesia Plan: General   Post-op Pain Management:    Induction: Intravenous  PONV Risk Score and Plan: 4 or greater and Ondansetron, Dexamethasone, Propofol infusion, TIVA, Midazolam and Amisulpride  Airway Management Planned: Oral ETT  Additional Equipment: None  Intra-op Plan:   Post-operative Plan: Extubation in OR  Informed Consent: I have reviewed  the patients History and Physical, chart, labs and discussed  the procedure including the risks, benefits and alternatives for the proposed anesthesia with the patient or authorized representative who has indicated his/her understanding and acceptance.     Dental advisory given  Plan Discussed with: CRNA and Anesthesiologist  Anesthesia Plan Comments:         Anesthesia Quick Evaluation

## 2021-11-14 NOTE — H&P (Addendum)
Emily Brock is an 65 y.o. female.   Chief Complaint: Back and bilateral leg pain, hip pain HPI: Emily Brock is a 65 year old individual who had been evaluated years ago.  He has had a previous cervical discectomy by me..  She did well for a number of years but here recently has been having increasing back and buttock pain she was seen by Dr. Trudee Grip who evaluated her hips and noted that her hips are stable but then suggested some work-up of the lumbar spine.  Plain x-ray demonstrates the patient has an advanced spondylolisthesis at the level of L4-L5 with a marked stenosis at L4-5 and also hypertrophy of the facets at L5-S1 creating entrapment the L5 nerve roots.  She has advanced degenerative changes at both these levels.  She had been evaluated and advised regarding surgical decompression at L4-5 and L5-S1 with posterior interbody arthrodesis.  She is now admitted for that procedure.  Past Medical History:  Diagnosis Date   Anemia    Asthma    Headache    PONV (postoperative nausea and vomiting)    Recurrent sinus infections     Past Surgical History:  Procedure Laterality Date   BACK SURGERY     CHOLECYSTECTOMY     right foot surgery     SHOULDER ARTHROSCOPY Right    times 5   SINUS EXPLORATION     thoracic scapular infusion  12/18/2011    Family History  Problem Relation Age of Onset   Breast cancer Neg Hx    Social History:  reports that she has never smoked. She does not have any smokeless tobacco history on file. She reports that she does not drink alcohol and does not use drugs.  Allergies:  Allergies  Allergen Reactions   Ciprofloxacin     Muscle cramps   Nsaids     Bleeding ulcer    No medications prior to admission.    Results for orders placed or performed during the hospital encounter of 11/13/21 (from the past 48 hour(s))  SARS CORONAVIRUS 2 (TAT 6-24 HRS) Nasopharyngeal Nasopharyngeal Swab     Status: None   Collection Time: 11/13/21 10:47 AM    Specimen: Nasopharyngeal Swab  Result Value Ref Range   SARS Coronavirus 2 NEGATIVE NEGATIVE    Comment: (NOTE) SARS-CoV-2 target nucleic acids are NOT DETECTED.  The SARS-CoV-2 RNA is generally detectable in upper and lower respiratory specimens during the acute phase of infection. Negative results do not preclude SARS-CoV-2 infection, do not rule out co-infections with other pathogens, and should not be used as the sole basis for treatment or other patient management decisions. Negative results must be combined with clinical observations, patient history, and epidemiological information. The expected result is Negative.  Fact Sheet for Patients: HairSlick.no  Fact Sheet for Healthcare Providers: quierodirigir.com  This test is not yet approved or cleared by the [place] FDA and  has been authorized for detection and/or diagnosis of SARS-CoV-2 by FDA under an Emergency Use Authorization (EUA). This EUA will remain  in effect (meaning this test can be used) for the duration of the COVID-19 declaration under Se ction 564(b)(1) of the Act, 21 U.S.C. section 360bbb-3(b)(1), unless the authorization is terminated or revoked sooner.  Performed at [facility], [address], [place], [place] [address]    No results found.  Review of Systems  Constitutional:  Positive for activity change.  Musculoskeletal:  Positive for back pain and gait problem.  Neurological:  Positive  for weakness and numbness.  All other systems reviewed and are negative.  There were no vitals taken for this visit. Physical Exam Constitutional:      Appearance: Normal appearance. She is normal weight.  HENT:     Head: Normocephalic and atraumatic.     Right Ear: Tympanic membrane normal.     Left Ear: Tympanic membrane normal.     Nose: Nose normal.     Mouth/Throat:     Mouth: Mucous membranes are moist.  Eyes:     Extraocular  Movements: Extraocular movements intact.     Conjunctiva/sclera: Conjunctivae normal.     Pupils: Pupils are equal, round, and reactive to light.  Cardiovascular:     Rate and Rhythm: Normal rate and regular rhythm.     Pulses: Normal pulses.     Heart sounds: Normal heart sounds.  Pulmonary:     Effort: Pulmonary effort is normal.     Breath sounds: Normal breath sounds.  Abdominal:     General: Bowel sounds are normal.     Palpations: Abdomen is soft.  Musculoskeletal:     Cervical back: Normal range of motion and neck supple.     Comments: Positive straight leg raising at 45 degrees Patrick's maneuver is negative  Skin:    General: Skin is warm and dry.     Capillary Refill: Capillary refill takes less than 2 seconds.  Neurological:     General: No focal deficit present.     Mental Status: She is alert and oriented to person, place, and time.  Psychiatric:        Mood and Affect: Mood normal.        Behavior: Behavior normal.        Thought Content: Thought content normal.        Judgment: Judgment normal.     Assessment/Plan Spondylolisthesis L4-5 stenosis L5-S1.  Plan: Decompression and fusion L4-5 and L5-S1.  Stefani Dama, MD 11/14/2021, 7:54 AM

## 2021-11-14 NOTE — Transfer of Care (Signed)
Immediate Anesthesia Transfer of Care Note  Patient: Emily Brock  Procedure(s) Performed: Lumbar four-five, Lumbar five-Sacral one Posterior Lumbar Interbody Fusion (Back)  Patient Location: PACU  Anesthesia Type:General  Level of Consciousness: awake, alert  and oriented  Airway & Oxygen Therapy: Patient Spontanous Breathing and Patient connected to nasal cannula oxygen  Post-op Assessment: Report given to RN and Post -op Vital signs reviewed and stable  Post vital signs: Reviewed and stable  Last Vitals:  Vitals Value Taken Time  BP 108/75 11/14/21 2007  Temp    Pulse 121 11/14/21 2007  Resp 18 11/14/21 2007  SpO2 100 % 11/14/21 2007  Vitals shown include unvalidated device data.  Last Pain:  Vitals:   11/14/21 1400  TempSrc:   PainSc: 9          Complications: No notable events documented.

## 2021-11-14 NOTE — Op Note (Signed)
Date of surgery: 11/14/2021 Preop diagnosis: Lumbar spondylolisthesis L4-L5 with lumbar radiculopathy lumbar stenosis L5-S1 with lumbar radiculopathy Postoperative diagnosis: Same Procedure: Bilateral laminotomies L4-5 with decompression of the L4 and L5 nerve roots with more work than required for simple interbody technique bilateral laminotomies L5-S1 with decompression of the L5 and S1 nerve roots with more work than required for simple interbody technique.  Posterior lumbar interbody arthrodesis with peek spacers local autograft and allograft L4-5 and L4-5 S1 posterolateral arthrodesis L4-5 and L5-S1 with local autograft and allograft pedicle screw fixation L4-L5 to sacrum.  Fluoroscopic guidance. Surgeon: Barnett Abu First Assistant: Hoyt Koch, MD Anesthesia: General endotracheal Indications: Emily Brock is a 65 year old individual whose had significant back and lower extremity pain.  She has developed a spondylolisthesis at the level of L4-L5 and is failed efforts at conservative management.  She has been advised regarding surgical decompression and stabilization.  Procedure: Patient was brought to the operating room supine on the stretcher.  After the smooth induction of general endotracheal anesthesia, she was carefully turned prone.  The back was prepped with alcohol DuraPrep and draped in a sterile fashion. Incision was created and carried down to the lumbodorsal fascia.  The fascia was opened on either side of midline to expose the spinous processes and laminar arches of L4-L5 and the sacrum.  The dissection was then carried out laterally to expose the facet joints at L4-5 and L5-S1.  Then by carefully isolating the lamina laminectomy was performed removing the inferior margin lamina of L4 out to and including the entirety of the facet at L4-L5.  This was done bilaterally.  Yellow ligament was taken up and then the common dural tube was identified.  This was carefully dissected free from  the surrounding bony attachments and a severe stenosis was noted at the L4-5 level on that left side particularly laminectomy allowed for good decompression.  A small durotomy had occurred in this region on the left side and this was oversewn with a singular 6-0 Prolene suture.  The procedure then continued by isolating the discs and isolating the L4 nerve root and decompressing it out the foramen.  The L5 nerve root was similarly isolated and the disc space was cleared.  Once this was accomplished bilaterally the space was opened with a #15 blade and a combination of curettes and rongeurs was used to evacuate substantial quantities of severely degenerated disc material from L4-L5.  Once the space was completely cleared the interbody space was sized for an 8 x 9 x 23 mm spacer with 12 degrees of lordosis this along with 15 cc of autograft allograft and Proteus was fit into the interspace.  Attention was then turned to L5-S1 where similar laminotomies were carried out.  The lateral recesses were decompressed and the path the L5 nerve root was decompressed there is noted to be markedly tighter on the left and was on the right.  Once the decompression was secured and the disc space was isolated the disc base was entered and a similar total discectomy was performed at L5-S1 using a combination of curettes rongeurs and disc shavers.  The interspace was then spread apart and fitted for an appropriate size spacer in this case it was 8 x 9 x 23 mm in size with 8 degrees of lordosis a total of 10 cc of bone graft was fit into this interspace along with the 2 spacers.  Pedicle entry sites were then chosen at L4-L5 and the sacrum these were checked fluoroscopically  then individual screws measuring 6.5 x 45 mm screws were placed in L4 and L5.  In the sacrum 6.5 x 40 mm screws were used precontoured 65 mm rods were then placed in a neutral construct and locked into position.  Final radiographs were obtained in AP and lateral  projection.  Lateral gutters were then packed with the remaining bone which only total approximately 6 cc.  Once this was accomplished hemostasis in the soft tissues was obtained meticulously care was taken to make sure that the L4 and L5 and S1 nerve roots and common dural tube were well decompressed and with hemostasis being achieved the lumbodorsal fascia was closed with #1 Vicryl in interrupted fashion 25 cc of half percent Marcaine was injected into the paraspinous fascia and then 2-0 and 3-0 Vicryl was used to close the subcutaneous tissues in the subcuticular layer respectively.  Dermabond was used on the skin.  Blood loss was estimated at 450 cc, 160 cc of Cell Saver blood was returned to the patient.

## 2021-11-14 NOTE — Progress Notes (Signed)
Orthopedic Tech Progress Note Patient Details:  Emily Brock 06/17/1956 [id number]  Patient ID: Emily Brock, female   DOB: 09/15/1956, 65 y.o.   MRN: [id number] Dropped brace off. Artis Flock  11/14/2021, 11:28 PM

## 2021-11-14 NOTE — Anesthesia Procedure Notes (Signed)
Procedure Name: Intubation Date/Time: 11/14/2021 3:04 PM Performed by: Rosiland Oz, CRNA Pre-anesthesia Checklist: Patient identified, Emergency Drugs available, Suction available, Patient being monitored and Timeout performed Patient Re-evaluated:Patient Re-evaluated prior to induction Oxygen Delivery Method: Circle system utilized Preoxygenation: Pre-oxygenation with 100% oxygen Induction Type: IV induction Ventilation: Mask ventilation without difficulty Laryngoscope Size: Miller and 3 Grade View: Grade I Tube type: Oral Tube size: 7.0 mm Number of attempts: 1 Airway Equipment and Method: Stylet Placement Confirmation: ETT inserted through vocal cords under direct vision, positive ETCO2 and breath sounds checked- equal and bilateral Secured at: 21 cm Tube secured with: Tape Dental Injury: Teeth and Oropharynx as per pre-operative assessment

## 2021-11-14 NOTE — Anesthesia Postprocedure Evaluation (Signed)
Anesthesia Post Note  Patient: Emily Brock  Procedure(s) Performed: Lumbar four-five, Lumbar five-Sacral one Posterior Lumbar Interbody Fusion (Back)     Patient location during evaluation: PACU Anesthesia Type: General Level of consciousness: awake and alert Pain management: pain level controlled Vital Signs Assessment: post-procedure vital signs reviewed and stable Respiratory status: spontaneous breathing, nonlabored ventilation and respiratory function stable Cardiovascular status: blood pressure returned to baseline and stable Postop Assessment: no apparent nausea or vomiting Anesthetic complications: no   No notable events documented.  Last Vitals:  Vitals:   11/14/21 2130 11/14/21 2145  BP: 99/66 (!) 117/104  Pulse: (!) 110 (!) 105  Resp: 20 17  Temp: 36.5 C 36.5 C  SpO2: 100% 99%    Last Pain:  Vitals:   11/14/21 2125  TempSrc:   PainSc: 5                  ,W. EDMOND

## 2021-11-15 LAB — BASIC METABOLIC PANEL
Anion gap: 8 (ref 5–15)
BUN: 9 mg/dL (ref 8–23)
CO2: 23 mmol/L (ref 22–32)
Calcium: 8.5 mg/dL — ABNORMAL LOW (ref 8.9–10.3)
Chloride: 105 mmol/L (ref 98–111)
Creatinine, Ser: 0.92 mg/dL (ref 0.44–1.00)
GFR, Estimated: >60 mL/min (ref >60)
Glucose, Bld: 222 mg/dL — ABNORMAL HIGH (ref 70–99)
Potassium: 4.3 mmol/L (ref 3.5–5.1)
Sodium: 136 mmol/L (ref 135–145)

## 2021-11-15 LAB — CBC
HCT: 31.3 % — ABNORMAL LOW (ref 36.0–46.0)
Hemoglobin: 10.2 g/dL — ABNORMAL LOW (ref 12.0–15.0)
MCH: 29.7 pg (ref 26.0–34.0)
MCHC: 32.6 g/dL (ref 30.0–36.0)
MCV: 91.3 fL (ref 80.0–100.0)
Platelets: 289 10*3/uL (ref 150–400)
RBC: 3.43 MIL/uL — ABNORMAL LOW (ref 3.87–5.11)
RDW: 12.2 % (ref 11.5–15.5)
WBC: 13.1 10*3/uL — ABNORMAL HIGH (ref 4.0–10.5)
nRBC: 0 % (ref 0.0–0.2)

## 2021-11-15 MED ORDER — METHOCARBAMOL 500 MG PO TABS: 500.0000 mg | ORAL_TABLET | Freq: Four times a day (QID) | ORAL | 2 refills | Status: DC | PRN

## 2021-11-15 MED ORDER — OXYCODONE-ACETAMINOPHEN 5-325 MG PO TABS: 1.0000 | ORAL_TABLET | ORAL | 0 refills | Status: DC | PRN

## 2021-11-15 MED FILL — Thrombin For Soln 5000 Unit: CUTANEOUS | Qty: 5000 | Status: AC

## 2021-11-15 NOTE — TOC Transition Note (Signed)
Transition of Care [facility]) - CM/SW Discharge Note   Patient Details  Name: VALERYE KOBUS MRN: [id number] Date of Birth: 03/31/1956  Transition of Care [facility]) CM/SW Contact:  Kermit Balo, RN Phone Number: 11/15/2021, 11:26 AM   Clinical Narrative:    Patient discharging home with self care. Pt states her son is staying with her a few days.  No f/u per PT and no DME needs.  Pt has transportation home.   Final next level of care: Home/Self Care Barriers to Discharge: No Barriers Identified   Patient Goals and CMS Choice        Discharge Placement                       Discharge Plan and Services                                     Social Determinants of Health (SDOH) Interventions     Readmission Risk Interventions No flowsheet data found.

## 2021-11-15 NOTE — Plan of Care (Signed)
  Problem: Activity: Goal: Ability to avoid complications of mobility impairment will improve Outcome: Adequate for Discharge Goal: Ability to tolerate increased activity will improve Outcome: Adequate for Discharge   Problem: Coping: Goal: Level of anxiety will decrease Outcome: Adequate for Discharge   Problem: Physical Regulation: Goal: Postoperative complications will be avoided or minimized Outcome: Adequate for Discharge   Problem: Respiratory: Goal: Ability to maintain a clear airway will improve Outcome: Adequate for Discharge   Problem: Health Behavior/Discharge Planning: Goal: Ability to manage health-related needs will improve Outcome: Adequate for Discharge   Problem: Activity: Goal: Risk for activity intolerance will decrease Outcome: Adequate for Discharge   Problem: Elimination: Goal: Will not experience complications related to bowel motility Outcome: Adequate for Discharge Goal: Will not experience complications related to urinary retention Outcome: Adequate for Discharge   Problem: Skin Integrity: Goal: Risk for impaired skin integrity will decrease Outcome: Adequate for Discharge

## 2021-11-15 NOTE — Progress Notes (Signed)
Patient ID: Emily Brock, female   DOB: 03/02/1956, 65 y.o.   MRN: [id number] Vital signs are stable Patient is ambulatory and complaining of modest minimal pain in the back She is eager to be discharged home.

## 2021-11-15 NOTE — Discharge Summary (Signed)
Physician Discharge Summary  Patient ID: Emily Brock MRN: [id number] DOB/AGE: [birth year] 65 y.o.  Admit date: 11/14/2021 Discharge date: 11/15/2021  Admission Diagnoses: Spondylolisthesis L4-5 stenosis L5-S1 lumbar radiculopathy, neurogenic claudication  Discharge Diagnoses: Spondylolisthesis L4-5, stenosis L5-S1.  Lumbar radiculopathy.  Neurogenic claudication. Principal Problem:   Spondylolisthesis at L4-L5 level   Discharged Condition: good  Hospital Course: Patient was admitted to undergo surgical decompression arthrodesis which she tolerated well.  She is ambulatory.  Her incisions are clean and dry.  Consults: None  Significant Diagnostic Studies: None  Treatments: surgery: See op note  Discharge Exam: Blood pressure 102/65, pulse 97, temperature (!) 97.4 F (36.3 C), temperature source Oral, resp. rate 17, height 5\' 3"  (1.6 m), weight 56.2 kg, SpO2 97 %. Station and gait are intact incision is clean and dry.  Disposition: Discharge disposition: 01-Home or Self Care       Discharge Instructions     Call MD for:  redness, tenderness, or signs of infection (pain, swelling, redness, odor or green/yellow discharge around incision site)   Complete by: As directed    Call MD for:  severe uncontrolled pain   Complete by: As directed    Call MD for:  temperature >100.4   Complete by: As directed    Diet - low sodium heart healthy   Complete by: As directed    Discharge wound care:   Complete by: As directed    Okay to shower. Do not apply salves or appointments to incision. No heavy lifting with the upper extremities greater than 10 pounds. May resume driving when not requiring pain medication and patient feels comfortable with doing so.   Incentive spirometry RT   Complete by: As directed    Increase activity slowly   Complete by: As directed       Allergies as of 11/15/2021       Reactions   Ciprofloxacin    Muscle cramps   Nsaids    Bleeding ulcer         Medication List     TAKE these medications    acetaminophen 500 MG tablet Commonly known as: TYLENOL Take 1,000 mg by mouth every 8 (eight) hours as needed for headache or moderate pain.   albuterol 108 (90 Base) MCG/ACT inhaler Commonly known as: VENTOLIN HFA Inhale 2 puffs into the lungs every 6 (six) hours as needed.   CALCIUM + D PO Take 1 tablet by mouth daily.   celecoxib 100 MG capsule Commonly known as: CELEBREX Take 100 mg by mouth at bedtime.   celecoxib 200 MG capsule Commonly known as: CELEBREX Take 200 mg by mouth every morning.   folic acid 400 MCG tablet Commonly known as: FOLVITE Take 400 mcg by mouth daily.   Glucosamine Chond Complex/MSM Tabs Take 2 tablets by mouth daily.   MELATONIN PO Take 4 mg by mouth at bedtime as needed (sleep).   methocarbamol 500 MG tablet Commonly known as: ROBAXIN Take 1 tablet (500 mg total) by mouth every 6 (six) hours as needed for muscle spasms.   omeprazole 20 MG capsule Commonly known as: PRILOSEC Take 20 mg by mouth daily.   oxyCODONE 5 MG immediate release tablet Commonly known as: Oxy IR/ROXICODONE Take 5 mg by mouth every 8 (eight) hours as needed for severe pain.   oxyCODONE-acetaminophen 5-325 MG tablet Commonly known as: PERCOCET/ROXICET Take 1-2 tablets by mouth every 4 (four) hours as needed for moderate pain or severe pain.   polyethylene glycol powder  17 GM/SCOOP powder Commonly known as: GLYCOLAX/MIRALAX Take 1 Container by mouth as needed.   Pulmicort 0.5 MG/2ML nebulizer solution Generic drug: budesonide Take 0.5 mg by nebulization daily as needed (Asthma).   zolpidem 10 MG tablet Commonly known as: AMBIEN Take 10 mg by mouth at bedtime.               Discharge Care Instructions  (From admission, onward)           Start     Ordered   11/15/21 0000  Discharge wound care:       Comments: Okay to shower. Do not apply salves or appointments to incision. No heavy lifting  with the upper extremities greater than 10 pounds. May resume driving when not requiring pain medication and patient feels comfortable with doing so.   11/15/21 0937             Signed: Shary Key  11/15/2021, 9:37 AM

## 2021-11-15 NOTE — Evaluation (Signed)
Occupational Therapy Evaluation Patient Details Name: Emily Brock MRN: [id number] DOB: 27-Oct-1956 Today's Date: 11/15/2021   History of Present Illness Pt is a 65yo who underwent an elective L4-5 PLIF due to advanced spondylolisthesis at L 4-5 and was experiencing pain. PSH: cervical discectomy, MH: anemia   Clinical Impression   Pt admitted for procedure listed above. PTA pt reported that she was independent with all ADL's and IADL's, including walking her 2 dogs and cooking/cleaning. At this time, pt presents with minimal pain and was able to complete ADL's with compensatory strategies, following spinal precautions. Pt recalls all 3 spinal precautions and was already planning ways to adapt things at home to make ADL's and IADL's easier for her. She has no further OT needs and acute OT will sign off.      Recommendations for follow up therapy are one component of a multi-disciplinary discharge planning process, led by the attending physician.  Recommendations may be updated based on patient status, additional functional criteria and insurance authorization.   Follow Up Recommendations  No OT follow up    Assistance Recommended at Discharge None  Functional Status Assessment  Patient has had a recent decline in their functional status and demonstrates the ability to make significant improvements in function in a reasonable and predictable amount of time.  Equipment Recommendations  None recommended by OT    Recommendations for Other Services       Precautions / Restrictions Precautions Precautions: Back Precaution Booklet Issued: Yes (comment) Precaution Comments: pt educated on BLT and return demonstrated good functional adherence Required Braces or Orthoses: Spinal Brace Spinal Brace: Lumbar corset;Applied in sitting position Restrictions Weight Bearing Restrictions: No      Mobility Bed Mobility Overal bed mobility: Needs Assistance Bed Mobility: Rolling;Sidelying to  Sit;Sit to Sidelying Rolling: Supervision Sidelying to sit: Supervision     Sit to sidelying: Supervision General bed mobility comments: Pt demonstrated log roll technique with no difficulties    Transfers Overall transfer level: Needs assistance Equipment used: None Transfers: Sit to/from Stand Sit to Stand: Supervision           General transfer comment: increased time, pushed up from bed then placed hands on knees to push up, minimal trunk flexion      Balance Overall balance assessment: Mild deficits observed, not formally tested                                         ADL either performed or assessed with clinical judgement   ADL Overall ADL's : Modified independent                                       General ADL Comments: Pt educated on compensatory strategies for ADL's, pt able to complete with no difficulties.     Vision Baseline Vision/History: 0 No visual deficits Ability to See in Adequate Light: 0 Adequate Patient Visual Report: No change from baseline Vision Assessment?: No apparent visual deficits     Perception     Praxis      Pertinent Vitals/Pain Pain Assessment: 0-10 Pain Score: 4  Pain Location: surgical site Pain Descriptors / Indicators: Aching;Discomfort Pain Intervention(s): Monitored during session;Repositioned     Hand Dominance Right   Extremity/Trunk Assessment Upper Extremity Assessment Upper Extremity Assessment: Overall WFL for  tasks assessed   Lower Extremity Assessment Lower Extremity Assessment: Overall WFL for tasks assessed   Cervical / Trunk Assessment Cervical / Trunk Assessment: Back Surgery   Communication Communication Communication: No difficulties   Cognition Arousal/Alertness: Awake/alert Behavior During Therapy: WFL for tasks assessed/performed Overall Cognitive Status: Within Functional Limits for tasks assessed                                 General  Comments: pt can be tangential     General Comments  Honeycomb dressing intact with no difficulties.    Exercises     Shoulder Instructions      Home Living Family/patient expects to be discharged to:: Private residence Living Arrangements: Alone Available Help at Discharge: Family;Available 24 hours/day (son and granddaughter to stay with pt upon pt d/c) Type of Home: House Home Access: Stairs to enter [employer] of Steps: 2 (small steps) Entrance Stairs-Rails: None (has a post she can use) Home Layout: One level     Bathroom Shower/Tub: Chief Strategy Officer: Standard     Home Equipment: BSC/3in1;Toilet riser          Prior Functioning/Environment Prior Level of Function : Independent/Modified Independent             Mobility Comments: has 2 dogs and was walking them but was having difficulty with inclines, walking was becoming more difficult ADLs Comments: indep        OT Problem List: Decreased strength;Decreased activity tolerance;Impaired balance (sitting and/or standing);Pain      OT Treatment/Interventions:      OT Goals(Current goals can be found in the care plan section) Acute Rehab OT Goals Patient Stated Goal: to walk her dogs OT Goal Formulation: With patient Time For Goal Achievement: 11/15/21 Potential to Achieve Goals: Good  OT Frequency:     Barriers to D/C:            Co-evaluation              AM-PAC OT "6 Clicks" Daily Activity     Outcome Measure Help from another person eating meals?: None Help from another person taking care of personal grooming?: None Help from another person toileting, which includes using toliet, bedpan, or urinal?: None Help from another person bathing (including washing, rinsing, drying)?: None Help from another person to put on and taking off regular upper body clothing?: None Help from another person to put on and taking off regular lower body clothing?: None 6 Click  Score: 24   End of Session Equipment Utilized During Treatment: Back brace Nurse Communication: Mobility status  Activity Tolerance: Patient tolerated treatment well Patient left: in chair;with call bell/phone within reach  OT Visit Diagnosis: Unsteadiness on feet (R26.81);Other abnormalities of gait and mobility (R26.89);Muscle weakness (generalized) (M62.81)                Time: 8315-1761 OT Time Calculation (min): 26 min Charges:  OT General Charges $OT Visit: 1 Visit OT Evaluation $OT Eval Low Complexity: 1 Low OT Treatments $Self Care/Home Management : 8-22 mins   H., OTR/L Acute Rehabilitation   [place]ane  11/15/2021, 2:17 PM

## 2021-11-15 NOTE — Evaluation (Signed)
Physical Therapy Evaluation Patient Details Name: Emily Brock MRN: [id number] DOB: [birth year] Today's Date: 11/15/2021  History of Present Illness  Pt is a 65yo who underwent an elective L4-5 PLIF due to advanced spondylolisthesis at L 4-5 and was experiencing pain. PSH: cervical discectomy, MH: anemia   Clinical Impression  Patient is s/p above surgery resulting in the deficits listed below (see PT Problem List). Pt moving well and with good comprehension and adherence to back precautions. Pt with good home stet up and support. Patient will benefit from skilled PT to increase their independence and safety with mobility (while adhering to their precautions) to allow discharge to the venue listed below.        Recommendations for follow up therapy are one component of a multi-disciplinary discharge planning process, led by the attending physician.  Recommendations may be updated based on patient status, additional functional criteria and insurance authorization.  Follow Up Recommendations No PT follow up    Assistance Recommended at Discharge Frequent or constant Supervision/Assistance  Functional Status Assessment Patient has had a recent decline in their functional status and demonstrates the ability to make significant improvements in function in a reasonable and predictable amount of time.  Equipment Recommendations  None recommended by PT (recommend pt get a shower chair from [employer] for tub)    Recommendations for Other Services       Precautions / Restrictions Precautions Precautions: Back Precaution Booklet Issued: No (spoke with OT to bring one) Precaution Comments: pt educated on BLT and return demonstrated good functional adherence Required Braces or Orthoses: Spinal Brace Spinal Brace: Lumbar corset;Applied in sitting position      Mobility  Bed Mobility Overal bed mobility: Needs Assistance Bed Mobility: Rolling;Sidelying to Sit;Sit to Sidelying Rolling:  Supervision Sidelying to sit: Supervision     Sit to sidelying: Supervision General bed mobility comments: verbal cues for log roll technique, HOB flat, no physical assist    Transfers Overall transfer level: Needs assistance Equipment used: None Transfers: Sit to/from Stand Sit to Stand: Min guard           General transfer comment: increased time, pushed up from bed then placed hands on knees to push up, minimal trunk flexion    Ambulation/Gait Ambulation/Gait assistance: Min guard Gait Distance (Feet): 150 Feet Assistive device: None Gait Pattern/deviations: Step-through pattern Gait velocity: slow Gait velocity interpretation: <1.31 ft/sec, indicative of household ambulator   General Gait Details: slow and guarded, able to maintain upright position, no episode of LOB  Stairs Stairs:  (discused step to negotiation vs reciprocal)          Wheelchair Mobility    Modified Rankin (Stroke Patients Only)       Balance Overall balance assessment: Mild deficits observed, not formally tested                                           Pertinent Vitals/Pain Pain Assessment: 0-10 Pain Score: 4  Pain Location: surgical site Pain Descriptors / Indicators: Aching;Discomfort Pain Intervention(s): Monitored during session;Premedicated before session    Home Living Family/patient expects to be discharged to:: Private residence Living Arrangements: Alone Available Help at Discharge: Family;Available 24 hours/day (son and granddaughter to stay with pt upon pt d/c) Type of Home: House Home Access: Stairs to enter Entrance Stairs-Rails: None (has a post she can use) Entrance Stairs-Number of Steps: 2 (small steps)  Home Layout: One level Home Equipment: BSC/3in1;Toilet riser      Prior Function Prior Level of Function : Independent/Modified Independent             Mobility Comments: has 2 dogs and was walking them but was having difficulty  with inclines, walking was becoming more difficult ADLs Comments: indep     Hand Dominance   Dominant Hand: Right    Extremity/Trunk Assessment   Upper Extremity Assessment Upper Extremity Assessment: Overall WFL for tasks assessed    Lower Extremity Assessment Lower Extremity Assessment: Overall WFL for tasks assessed    Cervical / Trunk Assessment Cervical / Trunk Assessment: Back Surgery  Communication   Communication: No difficulties  Cognition Arousal/Alertness: Awake/alert Behavior During Therapy: WFL for tasks assessed/performed Overall Cognitive Status: Within Functional Limits for tasks assessed                                 General Comments: pt can be tangential        General Comments General comments (skin integrity, edema, etc.): incision covered by dressing    Exercises     Assessment/Plan    PT Assessment Patient needs continued PT services  PT Problem List Decreased strength;Decreased activity tolerance;Decreased balance;Decreased mobility       PT Treatment Interventions DME instruction;Gait training;Stair training;Functional mobility training;Therapeutic activities;Therapeutic exercise    PT Goals (Current goals can be found in the Care Plan section)  Acute Rehab PT Goals Patient Stated Goal: home PT Goal Formulation: With patient Time For Goal Achievement: 11/29/21 Potential to Achieve Goals: Good    Frequency Min 5X/week   Barriers to discharge        Co-evaluation               AM-PAC PT "6 Clicks" Mobility  Outcome Measure Help needed turning from your back to your side while in a flat bed without using bedrails?: None Help needed moving from lying on your back to sitting on the side of a flat bed without using bedrails?: None Help needed moving to and from a bed to a chair (including a wheelchair)?: None Help needed standing up from a chair using your arms (e.g., wheelchair or bedside chair)?: A  Little Help needed to walk in hospital room?: A Little Help needed climbing 3-5 steps with a railing? : A Little 6 Click Score: 21    End of Session Equipment Utilized During Treatment: Back brace (downsized brace from XL to small for optimal support) Activity Tolerance: Patient tolerated treatment well Patient left: in chair;with call bell/phone within reach Nurse Communication: Mobility status PT Visit Diagnosis: Unsteadiness on feet (R26.81);Muscle weakness (generalized) (M62.81);Difficulty in walking, not elsewhere classified (R26.2)    Time: 5188-4166 PT Time Calculation (min) (ACUTE ONLY): 28 min   Charges:   PT Evaluation $PT Eval Moderate Complexity: 1 Mod PT Treatments $Gait Training: 8-22 mins        Lewis Shock, PT, DPT Acute Rehabilitation Services Pager #: [phone] Office #: [phone]   Iona Hansen 11/15/2021, 9:51 AM

## 2021-11-17 MED FILL — Heparin Sodium (Porcine) Inj 1000 Unit/ML: INTRAMUSCULAR | Qty: 30 | Status: AC

## 2021-11-17 MED FILL — Sodium Chloride IV Soln 0.9%: INTRAVENOUS | Qty: 1000 | Status: AC

## 2021-11-17 MED FILL — Sodium Chloride Irrigation Soln 0.9%: Qty: 3000 | Status: AC

## 2021-11-20 LAB — BPAM RBC
Blood Product Expiration Date: 202212202359
Blood Product Expiration Date: 202212202359
Blood Product Expiration Date: 202212202359
ISSUE DATE / TIME: 202211202153
ISSUE DATE / TIME: 202211202153
ISSUE DATE / TIME: 202211221540
Unit Type and Rh: 5100
Unit Type and Rh: 5100
Unit Type and Rh: 5100

## 2021-11-20 LAB — TYPE AND SCREEN
ABO/RH(D): O POS
Antibody Screen: POSITIVE
Unit division: 0
Unit division: 0
Unit division: 0

## 2022-01-10 ENCOUNTER — Other Ambulatory Visit: Payer: Self-pay | Admitting: Internal Medicine

## 2022-01-11 LAB — CBC
HCT: 42.1 % (ref 35.0–45.0)
Hemoglobin: 13.8 g/dL (ref 11.7–15.5)
MCH: 29.4 pg (ref 27.0–33.0)
MCHC: 32.8 g/dL (ref 32.0–36.0)
MCV: 89.8 fL (ref 80.0–100.0)
MPV: 9.2 fL (ref 7.5–12.5)
Platelets: 388 10*3/uL (ref 140–400)
RBC: 4.69 10*6/uL (ref 3.80–5.10)
RDW: 12 % (ref 11.0–15.0)
WBC: 7.5 10*3/uL (ref 3.8–10.8)

## 2022-01-11 LAB — LIPID PANEL
Cholesterol: 206 mg/dL — ABNORMAL HIGH (ref <200)
HDL: 65 mg/dL (ref >=50)
LDL Cholesterol (Calc): 116 mg/dL (calc) — ABNORMAL HIGH
Non-HDL Cholesterol (Calc): 141 mg/dL (calc) — ABNORMAL HIGH (ref <130)
Total CHOL/HDL Ratio: 3.2 (calc) (ref <5.0)
Triglycerides: 140 mg/dL (ref <150)

## 2022-01-11 LAB — COMPLETE METABOLIC PANEL WITH GFR
AG Ratio: 1.6 (calc) (ref 1.0–2.5)
ALT: 16 U/L (ref 6–29)
AST: 17 U/L (ref 10–35)
Albumin: 4.8 g/dL (ref 3.6–5.1)
Alkaline phosphatase (APISO): 66 U/L (ref 37–153)
BUN: 13 mg/dL (ref 7–25)
CO2: 28 mmol/L (ref 20–32)
Calcium: 10.1 mg/dL (ref 8.6–10.4)
Chloride: 101 mmol/L (ref 98–110)
Creat: 0.84 mg/dL (ref 0.50–1.05)
Globulin: 3 g/dL (calc) (ref 1.9–3.7)
Glucose, Bld: 112 mg/dL — ABNORMAL HIGH (ref 65–99)
Potassium: 4.2 mmol/L (ref 3.5–5.3)
Sodium: 137 mmol/L (ref 135–146)
Total Bilirubin: 0.4 mg/dL (ref 0.2–1.2)
Total Protein: 7.8 g/dL (ref 6.1–8.1)
eGFR: 77 mL/min/{1.73_m2} (ref >=60)

## 2022-01-11 LAB — VITAMIN D 25 HYDROXY (VIT D DEFICIENCY, FRACTURES): Vit D, 25-Hydroxy: 40 ng/mL (ref 30–100)

## 2022-02-22 IMAGING — RF DG LUMBAR SPINE 2-3V
1 series · 3 of 3 positions shown · non-contrast
Comparison: Intraoperative lumbar spine radiograph from earlier
today

CLINICAL DATA: Lumbar surgery.

EXAM:
LUMBAR SPINE - 2-3 VIEW

[Series 1: run · 3 of 3 slices shown]
[im 1/3]
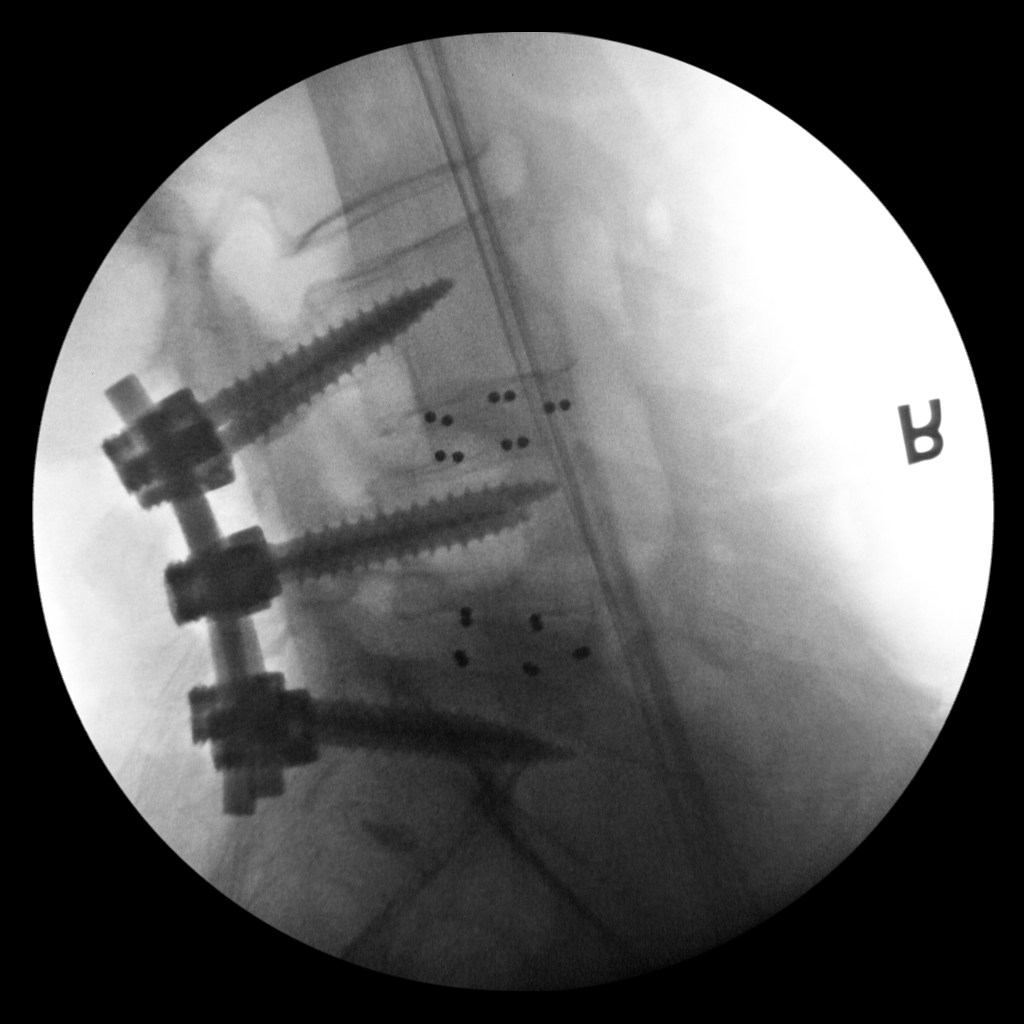
[im 2/3]
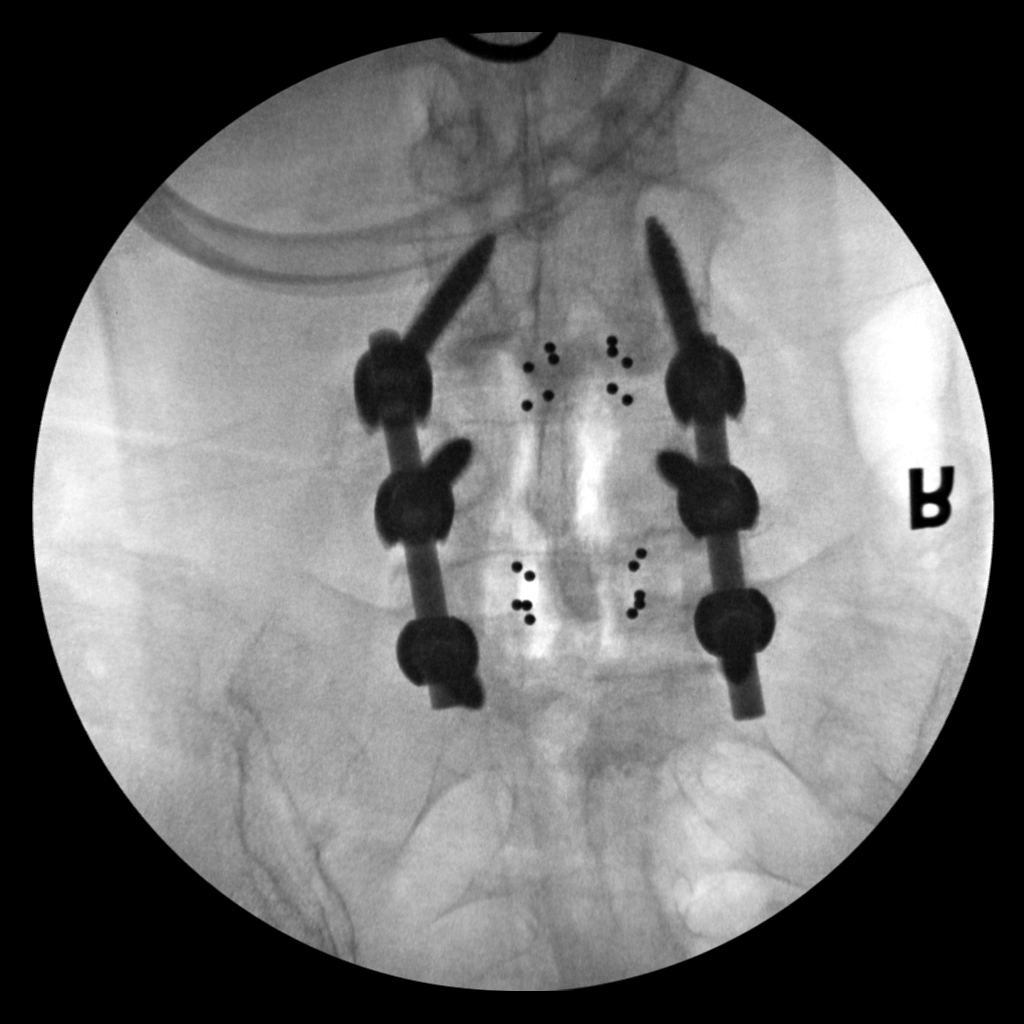
[im 3/3]
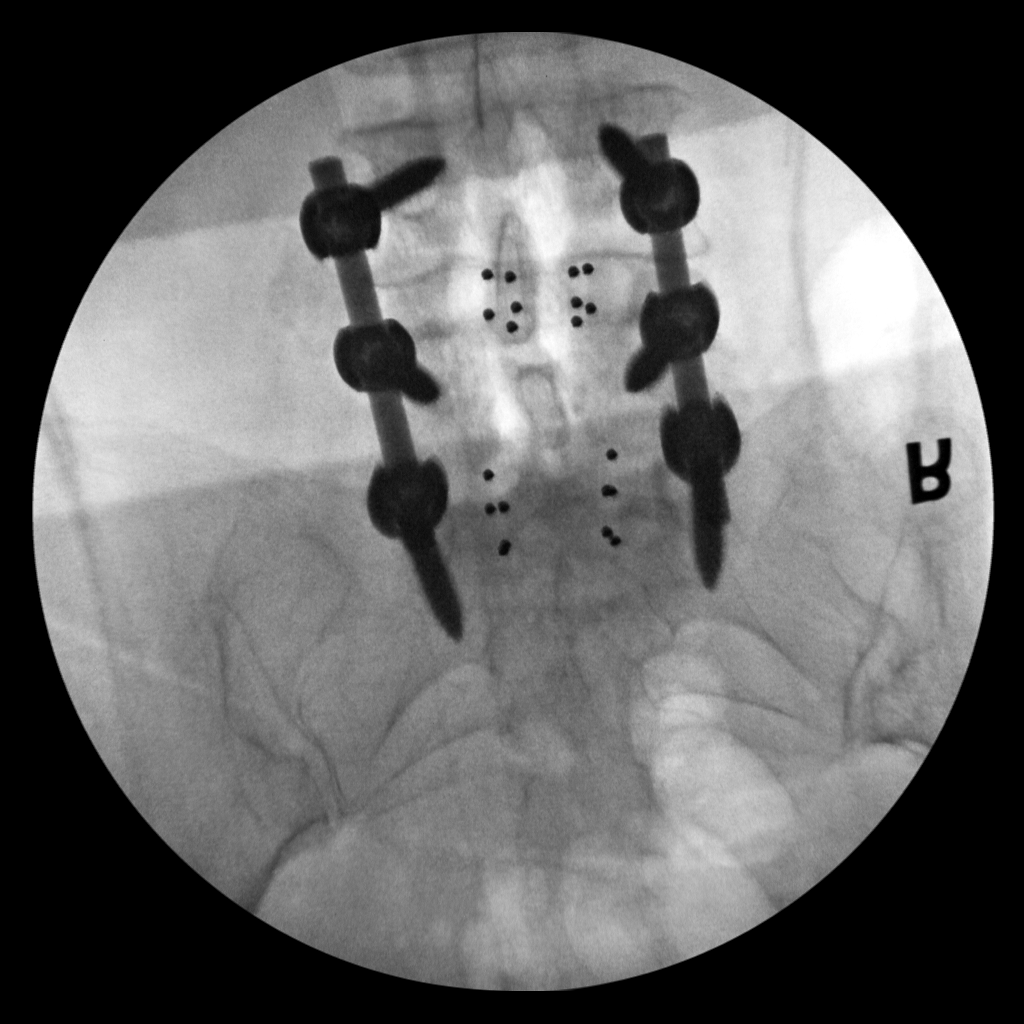

[3 of 3 positions shown; findings below may reference images not displayed]

FLUOROSCOPY TIME:  Fluoroscopy Time: 13 seconds

Radiation Exposure Index: 7.85 mGy
FINDINGS: Three intraoperative spot fluoroscopic images of the lumbar spine
are provided. Pedicle screws and interconnecting rods are now in
place bilaterally at L4, L5, and S1, and interbody spacers are in
place at L4-5 and L5-S1.
IMPRESSION: Intraoperative images during L4-S1 fusion.

## 2022-02-22 IMAGING — CR DG LUMBAR SPINE 1V
1 series · 1 of 1 positions shown · non-contrast
Comparison: Lumbar spine radiographs 10/11/2021

CLINICAL DATA: Lumbar surgery.

EXAM:
LUMBAR SPINE - 1 VIEW

[xtable lateral]
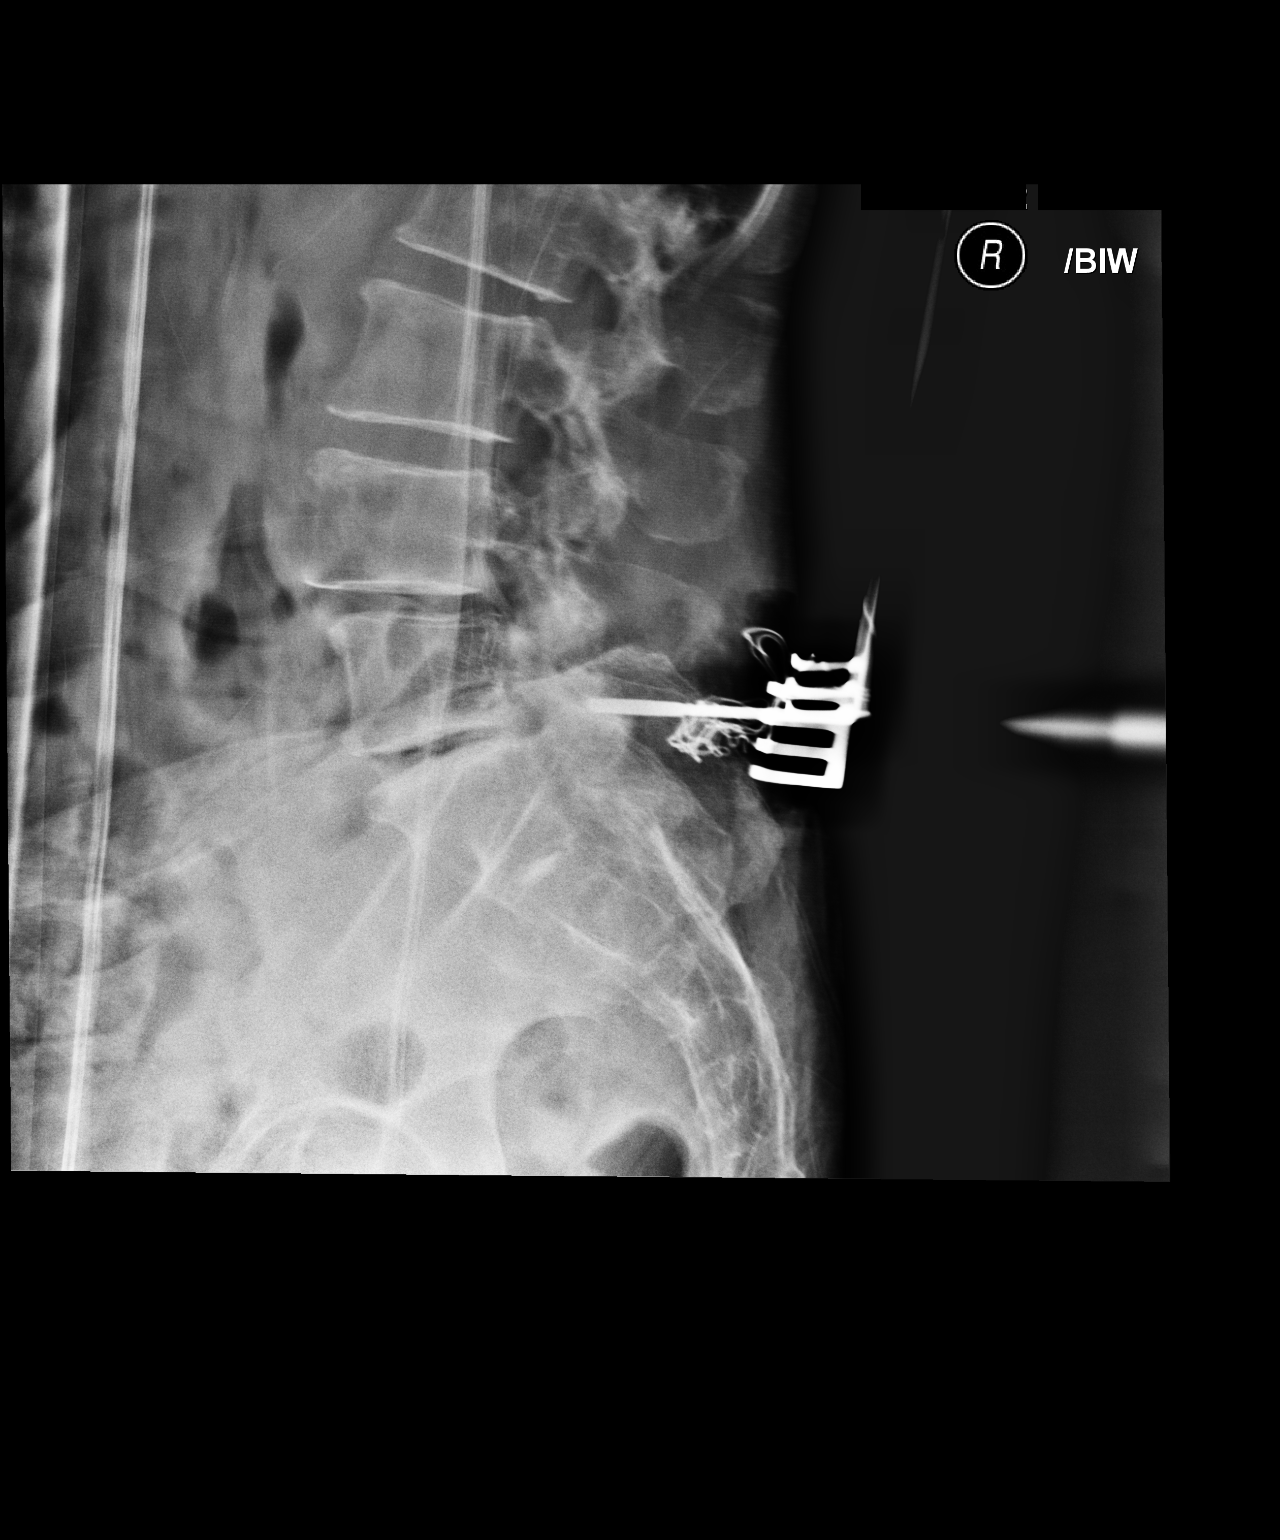

[1 of 1 positions shown; findings below may reference images not displayed]

FINDINGS: A single portable cross-table lateral radiograph of the lumbar spine
is provided. Lumbar segmentation is normal based on the prior
radiographs. There is grade 1 anterolisthesis of L4 on L5. The tip
of a surgical instrument projects over the posterior elements at the
L5-S1 level.
IMPRESSION: Intraoperative localization as above.

## 2022-05-10 ENCOUNTER — Other Ambulatory Visit: Payer: Self-pay | Admitting: Internal Medicine

## 2022-05-10 DIAGNOSIS — Z1231 Encounter for screening mammogram for malignant neoplasm of breast: Secondary | ICD-10-CM

## 2022-05-22 ENCOUNTER — Ambulatory Visit: Payer: Medicare Other

## 2022-05-29 ENCOUNTER — Ambulatory Visit
Admission: RE | Admit: 2022-05-29 | Discharge: 2022-05-29 | Disposition: A | Discharge status: Home / Self Care | Payer: Medicare Other | Source: Ambulatory Visit | Attending: Internal Medicine | Admitting: Internal Medicine

## 2022-05-29 DIAGNOSIS — Z1231 Encounter for screening mammogram for malignant neoplasm of breast: Secondary | ICD-10-CM

## 2022-06-04 ENCOUNTER — Other Ambulatory Visit: Payer: Self-pay | Admitting: Internal Medicine

## 2022-06-04 DIAGNOSIS — E2839 Other primary ovarian failure: Secondary | ICD-10-CM

## 2022-09-06 IMAGING — MG MM DIGITAL SCREENING BILAT W/ TOMO AND CAD
8 series · 9 of 24 positions shown · non-contrast
Comparison: Previous exam(s).

CLINICAL DATA: Screening.

EXAM:
DIGITAL SCREENING BILATERAL MAMMOGRAM WITH TOMOSYNTHESIS AND CAD
TECHNIQUE: Bilateral screening digital craniocaudal and mediolateral oblique
mammograms were obtained. Bilateral screening digital breast
tomosynthesis was performed. The images were evaluated with
computer-aided detection.

[L MLO synth-2D]
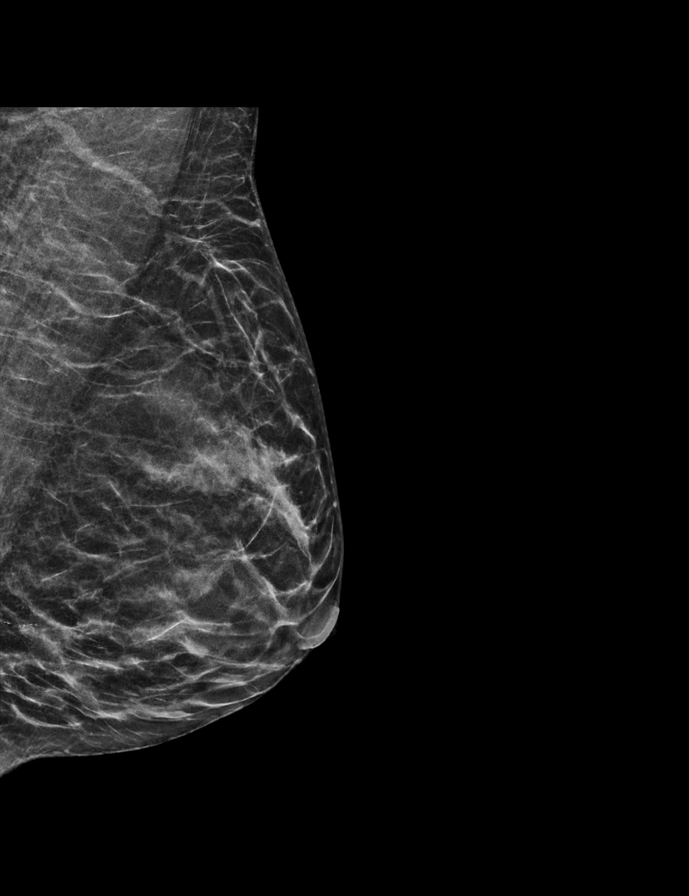

[R MLO synth-2D]
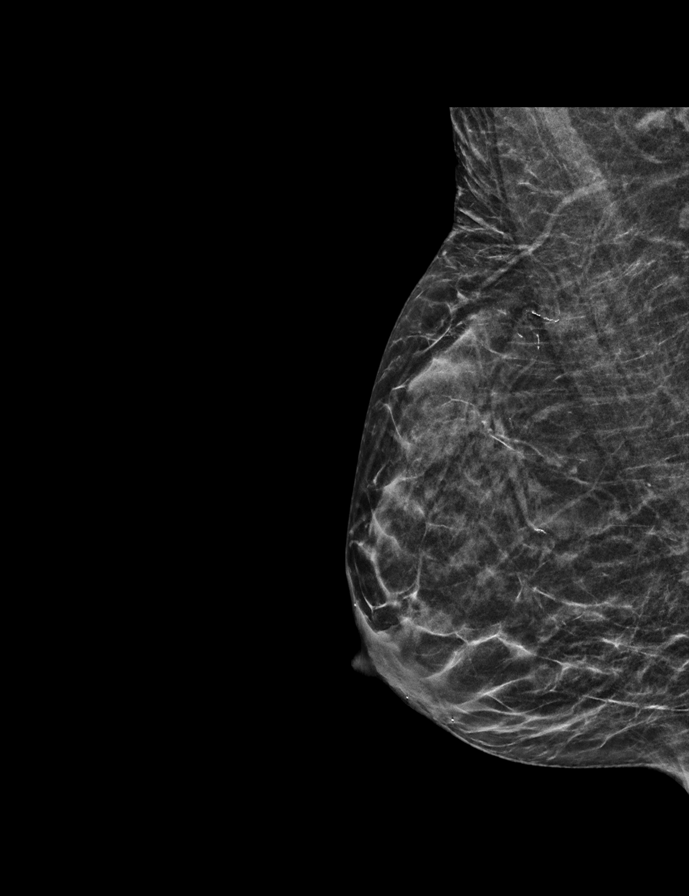

[L CC synth-2D]
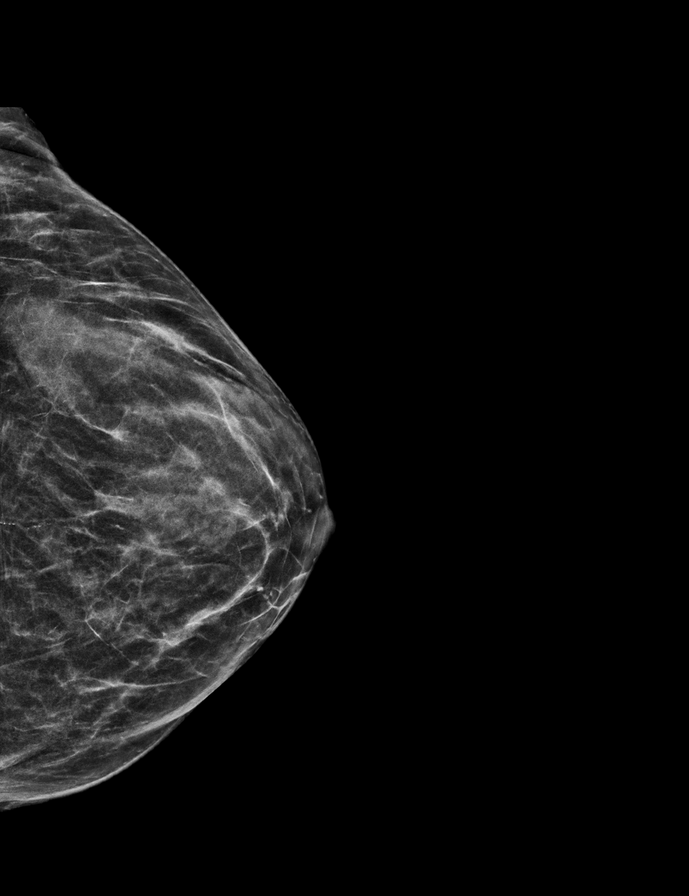

[R CC synth-2D]
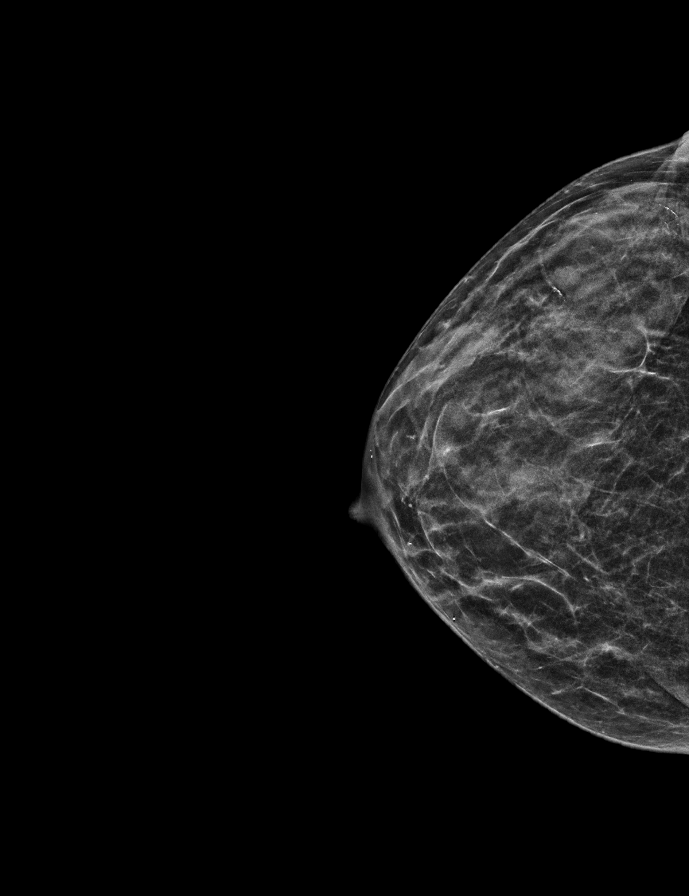

[L MLO tomo · 2 of 42 frames shown]
[frame 14/42]
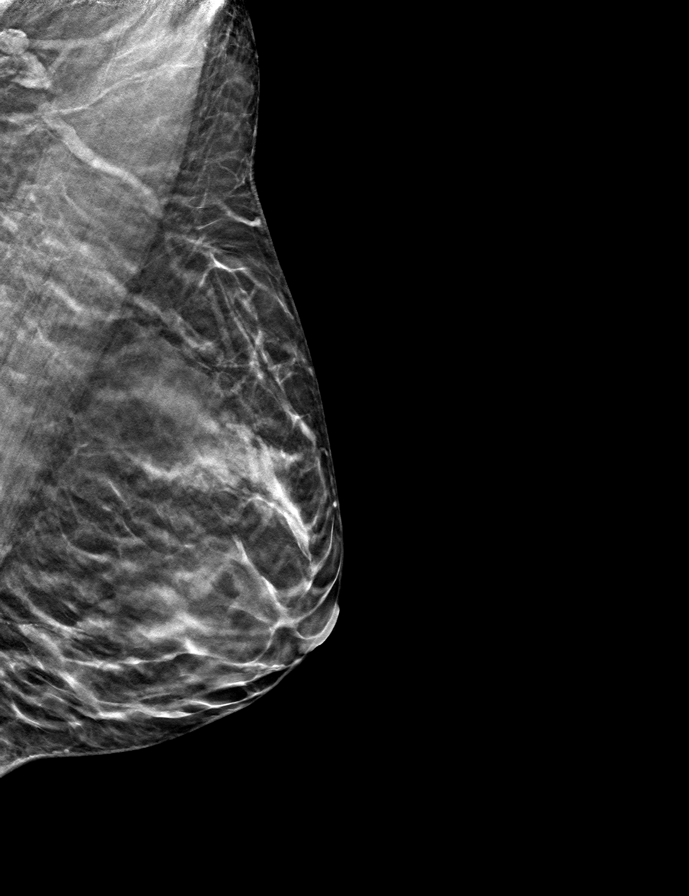
[frame 21/42]
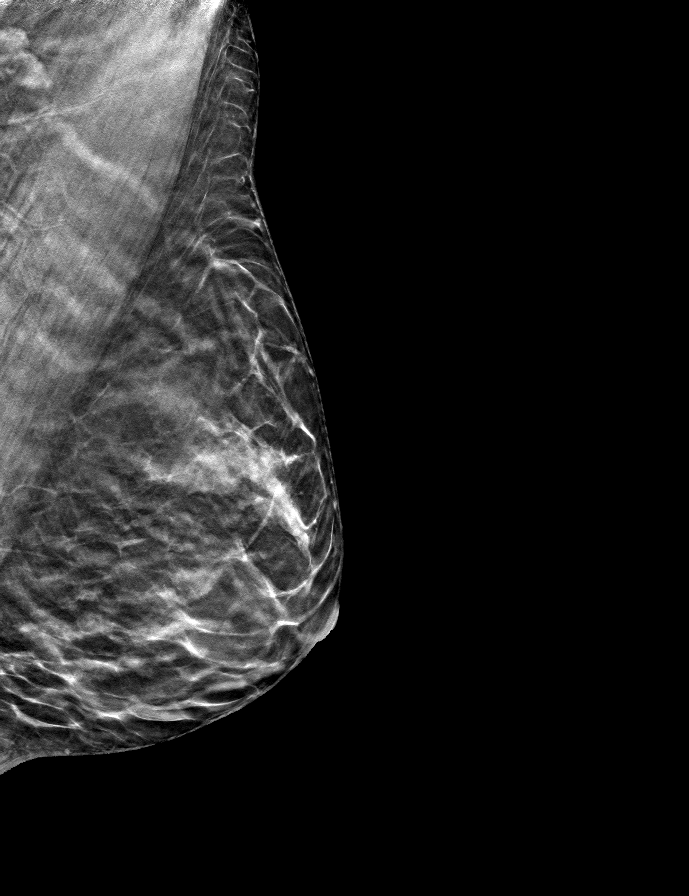

[R MLO tomo · tomo slice 21/41.0]
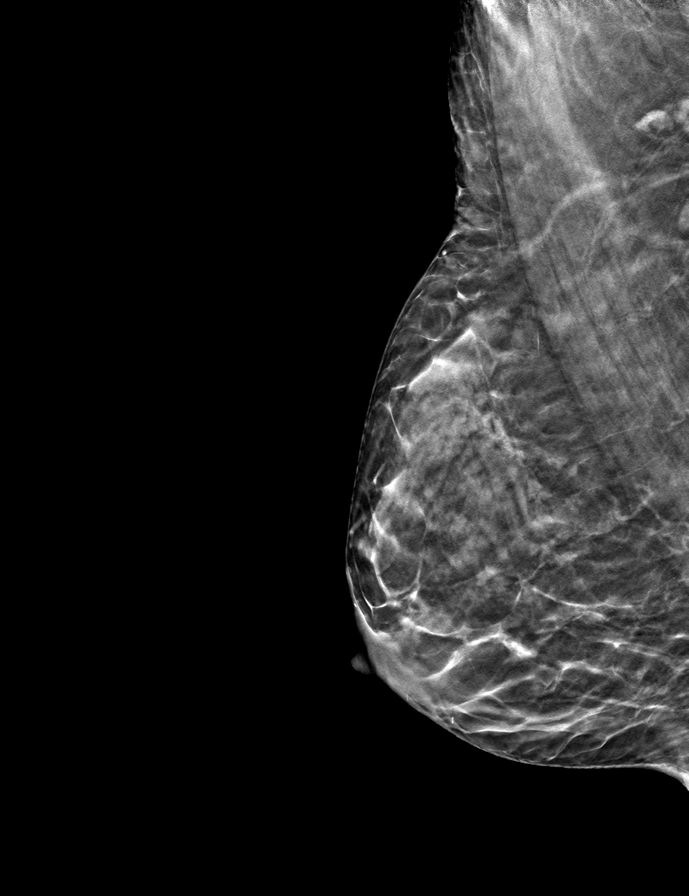

[R CC tomo · tomo slice 19/38.0]
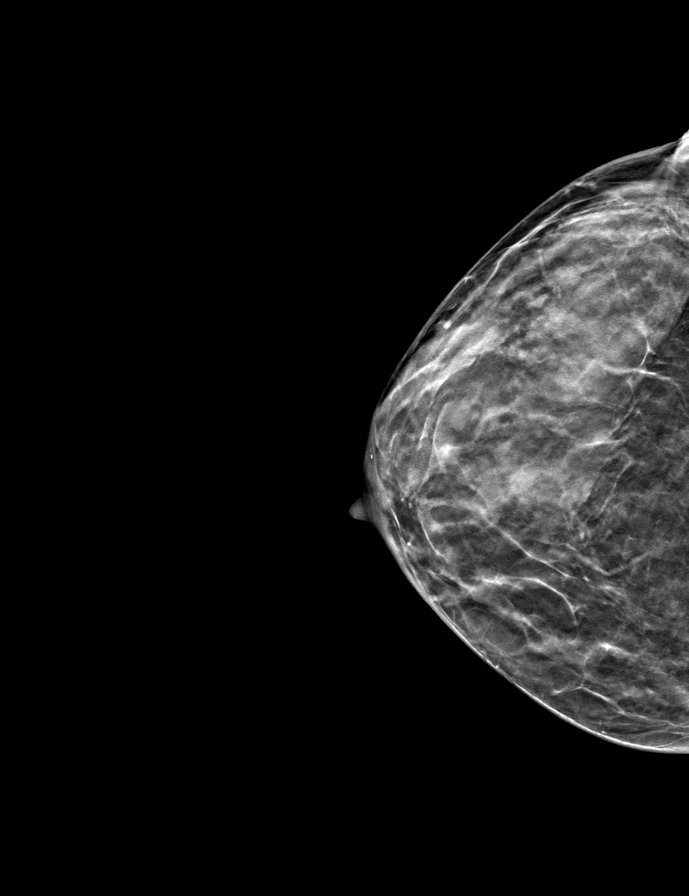

[L CC tomo · tomo slice 22/43.0]
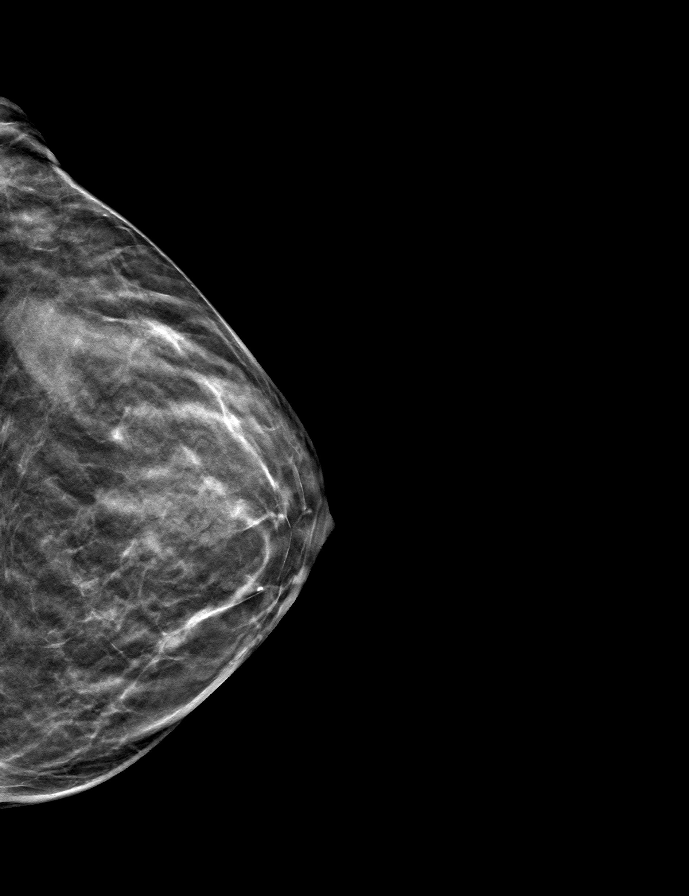

[9 of 24 positions shown; findings below may reference images not displayed]

ACR Breast Density Category c: The breast tissue is heterogeneously
dense, which may obscure small masses.
FINDINGS: There are no findings suspicious for malignancy.
IMPRESSION: No mammographic evidence of malignancy. A result letter of this
screening mammogram will be mailed directly to the patient.

RECOMMENDATION:
Screening mammogram in one year. (Code:[id number]-W-BC3)

BI-RADS CATEGORY  1: Negative.

## 2022-11-20 ENCOUNTER — Inpatient Hospital Stay: Admission: RE | Admit: 2022-11-20 | Payer: Medicare Other | Source: Ambulatory Visit

## 2022-11-26 ENCOUNTER — Ambulatory Visit
Admission: RE | Admit: 2022-11-26 | Discharge: 2022-11-26 | Disposition: A | Discharge status: Home / Self Care | Payer: Medicare Other | Source: Ambulatory Visit | Attending: Internal Medicine | Admitting: Internal Medicine

## 2022-11-26 DIAGNOSIS — E2839 Other primary ovarian failure: Secondary | ICD-10-CM

## 2023-06-25 ENCOUNTER — Other Ambulatory Visit: Payer: Self-pay | Admitting: Internal Medicine

## 2023-06-25 DIAGNOSIS — Z1231 Encounter for screening mammogram for malignant neoplasm of breast: Secondary | ICD-10-CM

## 2023-07-10 ENCOUNTER — Ambulatory Visit: Payer: 59

## 2023-07-11 ENCOUNTER — Ambulatory Visit: Payer: 59

## 2023-07-21 NOTE — Patient Instructions (Signed)
SURGICAL WAITING ROOM VISITATION Patients having surgery or a procedure may have no more than 2 support people in the waiting area - these visitors may rotate in the visitor waiting room.   Due to an increase in RSV and influenza rates and associated hospitalizations, children ages 71 and under may not visit patients in [facility] hospitals. If the patient needs to stay at the hospital during part of their recovery, the visitor guidelines for inpatient rooms apply.  PRE-OP VISITATION  Pre-op nurse will coordinate an appropriate time for 1 support person to accompany the patient in pre-op.  This support person may not rotate.  This visitor will be contacted when the time is appropriate for the visitor to come back in the pre-op area.  Please refer to the [facility] website for the visitor guidelines for Inpatients (after your surgery is over and you are in a regular room).  You are not required to quarantine at this time prior to your surgery. However, you must do this: Hand Hygiene often Do NOT share personal items Notify your provider if you are in close contact with someone who has COVID or you develop fever 100.4 or greater, new onset of sneezing, cough, sore throat, shortness of breath or body aches.  If you test positive for Covid or have been in contact with anyone that has tested positive in the last 10 days please notify you surgeon.    Your procedure is scheduled on:  MONDAY  August 05, 2023  Report to [facility] Main Entrance: [address] entrance where the [employer] is available.   Report to admitting at: 09:00    AM  Call this number if you have any questions or problems the morning of surgery [phone]  Do not eat food after Midnight the night prior to your surgery/procedure.  After Midnight you may have the following liquids until  08:30  AM  DAY OF SURGERY  Clear Liquid Diet Water Black Coffee (sugar ok, NO MILK/CREAM OR CREAMERS)  Tea (sugar ok, NO  MILK/CREAM OR CREAMERS) regular and decaf                             Plain Jell-O  with no fruit (NO RED)                                           Fruit ices (not with fruit pulp, NO RED)                                     Popsicles (NO RED)                                                                  Juice: NO CITRUS JUICES: only apple, WHITE grape, WHITE cranberry Sports drinks like Gatorade or Powerade (NO RED)                   The day of surgery:  Drink ONE (1) Pre-Surgery Clear Ensure at   08:30 AM the morning  of surgery. Drink in one sitting. Do not sip.  This drink was given to you during your hospital pre-op appointment visit. Nothing else to drink after completing the Pre-Surgery Clear Ensure  : No candy, chewing gum or throat lozenges.    FOLLOW  ANY ADDITIONAL PRE OP INSTRUCTIONS YOU RECEIVED FROM YOUR SURGEON'S OFFICE!!!   Oral Hygiene is also important to reduce your risk of infection.        Remember - BRUSH YOUR TEETH THE MORNING OF SURGERY WITH YOUR REGULAR TOOTHPASTE  Do NOT smoke after Midnight the night before surgery.  STOP TAKING all Vitamins, Herbs and supplements 1 week before your surgery.   Take ONLY these medicines the morning of surgery with A SIP OF WATER: omeprazole (Prilosec),   If You have been diagnosed with Sleep Apnea - Bring CPAP mask and tubing day of surgery. We will provide you with a CPAP machine on the day of your surgery.                   You may not have any metal on your body including hair pins, jewelry, and body piercing  Do not wear make-up, lotions, powders, perfumes or deodorant  Do not wear nail polish including gel and S&S, artificial / acrylic nails, or any other type of covering on natural nails including finger and toenails. If you have artificial nails, gel coating, etc., that needs to be removed by a nail salon, Please have this removed prior to surgery. Not doing so may mean that your surgery could be cancelled or  delayed if the Surgeon or anesthesia staff feels like they are unable to monitor you safely.   Do not shave 48 hours prior to surgery to avoid nicks in your skin which may contribute to postoperative infections.   Contacts, Hearing Aids, dentures or bridgework may not be worn into surgery. DENTURES WILL BE REMOVED PRIOR TO SURGERY PLEASE DO NOT APPLY "Poly grip" OR ADHESIVES!!!  You may bring a small overnight bag with you on the day of surgery, only pack items that are not valuable. [facility] IS NOT RESPONSIBLE   FOR VALUABLES THAT ARE LOST OR STOLEN.   Do not bring your home medications to the hospital. The Pharmacy will dispense medications listed on your medication list to you during your admission in the Hospital.  Special Instructions: Bring a copy of your healthcare power of attorney and living will documents the day of surgery, if you wish to have them scanned into your [facility] Records- EPIC  Please read over the following fact sheets you were given: IF YOU HAVE QUESTIONS ABOUT YOUR PRE-OP INSTRUCTIONS, PLEASE CALL [phone].     Pre-operative 5 CHG Bath Instructions   You can play a key role in reducing the risk of infection after surgery. Your skin needs to be as free of germs as possible. You can reduce the number of germs on your skin by washing with CHG (chlorhexidine gluconate) soap before surgery. CHG is an antiseptic soap that kills germs and continues to kill germs even after washing.   DO NOT use if you have an allergy to chlorhexidine/CHG or antibacterial soaps. If your skin becomes reddened or irritated, stop using the CHG and notify one of our RNs at [phone]  Please shower with the CHG soap starting 4 days before surgery using the following schedule: START SHOWERS ON   THURSDAY  August 01, 2023  Please keep in mind the following:  DO NOT shave, including legs and underarms, starting the day of your first shower.   You may shave your face at any point before/day of surgery.   Place clean sheets on your bed the day you start using CHG soap. Use a clean washcloth (not used since being washed) for each shower. DO NOT sleep with pets once you start using the CHG.   CHG Shower Instructions:  If you choose to wash your hair and private area, wash first with your normal shampoo/soap.  After you use shampoo/soap, rinse your hair and body thoroughly to remove shampoo/soap residue.  Turn the water OFF and apply about 3 tablespoons (45 ml) of CHG soap to a CLEAN washcloth.  Apply CHG soap ONLY FROM YOUR NECK DOWN TO YOUR TOES (washing for 3-5 minutes)  DO NOT use CHG soap on face, private areas, open wounds, or sores.  Pay special attention to the area where your surgery is being performed.  If you are having back surgery, having someone wash your back for you may be helpful.  Wait 2 minutes after CHG soap is applied, then you may rinse off the CHG soap.  Pat dry with a clean towel  Put on clean clothes/pajamas   If you choose to wear lotion, please use ONLY the CHG-compatible lotions on the back of this paper.     Additional instructions for the day of surgery: DO NOT APPLY any lotions, deodorants, cologne, or perfumes.   Put on clean/comfortable clothes.  Brush your teeth.  Ask your nurse before applying any prescription medications to the skin.      CHG Compatible Lotions   Aveeno Moisturizing lotion  Cetaphil Moisturizing Cream  Cetaphil Moisturizing Lotion  Clairol Herbal Essence Moisturizing Lotion, Dry Skin  Clairol Herbal Essence Moisturizing Lotion, Extra Dry Skin  Clairol Herbal Essence Moisturizing Lotion, Normal Skin  Curel Age Defying Therapeutic Moisturizing Lotion with Alpha Hydroxy  Curel Extreme Care Body Lotion  Curel Soothing Hands  Moisturizing Hand Lotion  Curel Therapeutic Moisturizing Cream, Fragrance-Free  Curel Therapeutic Moisturizing Lotion, Fragrance-Free  Curel Therapeutic Moisturizing Lotion, Original Formula  Eucerin Daily Replenishing Lotion  Eucerin Dry Skin Therapy Plus Alpha Hydroxy Crme  Eucerin Dry Skin Therapy Plus Alpha Hydroxy Lotion  Eucerin Original Crme  Eucerin Original Lotion  Eucerin Plus Crme Eucerin Plus Lotion  Eucerin TriLipid Replenishing Lotion  Keri Anti-Bacterial Hand Lotion  Keri Deep Conditioning Original Lotion Dry Skin Formula Softly Scented  Keri Deep Conditioning Original Lotion, Fragrance Free Sensitive Skin Formula  Keri Lotion Fast Absorbing Fragrance Free Sensitive Skin Formula  Keri Lotion Fast Absorbing Softly Scented Dry Skin Formula  Keri Original Lotion  Keri Skin Renewal Lotion Keri Silky Smooth Lotion  Keri Silky Smooth Sensitive Skin Lotion  Nivea Body Creamy Conditioning Oil  Nivea Body Extra Enriched Lotion  Nivea Body Original Lotion  Nivea Body Sheer Moisturizing Lotion Nivea Crme  Nivea Skin Firming Lotion  NutraDerm 30 Skin Lotion  NutraDerm Skin Lotion  NutraDerm Therapeutic Skin Cream  NutraDerm Therapeutic Skin Lotion  ProShield Protective Hand Cream  Provon moisturizing lotion   FAILURE TO FOLLOW THESE INSTRUCTIONS MAY RESULT IN THE CANCELLATION OF YOUR SURGERY  PATIENT SIGNATURE_________________________________  NURSE SIGNATURE__________________________________  ________________________________________________________________________      Emily Brock    An incentive spirometer is a tool that can help keep your lungs clear and active. This tool measures how well you are filling your lungs with each breath. Taking long deep  breaths may help reverse or decrease the chance of developing breathing (pulmonary) problems (especially infection) following: A long period of time when you are unable to move or be active. BEFORE THE  PROCEDURE  If the spirometer includes an indicator to show your best effort, your nurse or respiratory therapist will set it to a desired goal. If possible, sit up straight or lean slightly forward. Try not to slouch. Hold the incentive spirometer in an upright position. INSTRUCTIONS FOR USE  Sit on the edge of your bed if possible, or sit up as far as you can in bed or on a chair. Hold the incentive spirometer in an upright position. Breathe out normally. Place the mouthpiece in your mouth and seal your lips tightly around it. Breathe in slowly and as deeply as possible, raising the piston or the ball toward the top of the column. Hold your breath for 3-5 seconds or for as long as possible. Allow the piston or ball to fall to the bottom of the column. Remove the mouthpiece from your mouth and breathe out normally. Rest for a few seconds and repeat Steps 1 through 7 at least 10 times every 1-2 hours when you are awake. Take your time and take a few normal breaths between deep breaths. The spirometer may include an indicator to show your best effort. Use the indicator as a goal to work toward during each repetition. After each set of 10 deep breaths, practice coughing to be sure your lungs are clear. If you have an incision (the cut made at the time of surgery), support your incision when coughing by placing a pillow or rolled up towels firmly against it. Once you are able to get out of bed, walk around indoors and cough well. You may stop using the incentive spirometer when instructed by your caregiver.  RISKS AND COMPLICATIONS Take your time so you do not get dizzy or light-headed. If you are in pain, you may need to take or ask for pain medication before doing incentive spirometry. It is harder to take a deep breath if you are having pain. AFTER USE Rest and breathe slowly and easily. It can be helpful to keep track of a log of your progress. Your caregiver can provide you with a simple table  to help with this. If you are using the spirometer at home, follow these instructions: SEEK MEDICAL CARE IF:  You are having difficultly using the spirometer. You have trouble using the spirometer as often as instructed. Your pain medication is not giving enough relief while using the spirometer. You develop fever of 100.5 F (38.1 C) or higher.                                                                                                    SEEK IMMEDIATE MEDICAL CARE IF:  You cough up bloody sputum that had not been present before. You develop fever of 102 F (38.9 C) or greater. You develop worsening pain at or near the incision site. MAKE SURE YOU:  Understand these instructions. Will watch your condition. Will get  help right away if you are not doing well or get worse. Document Released: 04/15/2007 Document Revised: 02/25/2012 Document Reviewed: 06/16/2007 [facility] Patient Information 2014 [place], [place].     WHAT IS A BLOOD TRANSFUSION? Blood Transfusion Information  A transfusion is the replacement of blood or some of its parts. Blood is made up of multiple cells which provide different functions. Red blood cells carry oxygen and are used for blood loss replacement. White blood cells fight against infection. Platelets control bleeding. Plasma helps clot blood. Other blood products are available for specialized needs, such as hemophilia or other clotting disorders. BEFORE THE TRANSFUSION  Who gives blood for transfusions?  Healthy volunteers who are fully evaluated to make sure their blood is safe. This is blood bank blood. Transfusion therapy is the safest it has ever been in the practice of medicine. Before blood is taken from a donor, a complete history is taken to make sure that person has no history of diseases nor engages in risky social behavior (examples are intravenous drug use or sexual activity with multiple partners). The donor's travel history is screened to  minimize risk of transmitting infections, such as malaria. The donated blood is tested for signs of infectious diseases, such as HIV and hepatitis. The blood is then tested to be sure it is compatible with you in order to minimize the chance of a transfusion reaction. If you or a relative donates blood, this is often done in anticipation of surgery and is not appropriate for emergency situations. It takes many days to process the donated blood. RISKS AND COMPLICATIONS Although transfusion therapy is very safe and saves many lives, the main dangers of transfusion include:  Getting an infectious disease. Developing a transfusion reaction. This is an allergic reaction to something in the blood you were given. Every precaution is taken to prevent this. The decision to have a blood transfusion has been considered carefully by your caregiver before blood is given. Blood is not given unless the benefits outweigh the risks. AFTER THE TRANSFUSION Right after receiving a blood transfusion, you will usually feel much better and more energetic. This is especially true if your red blood cells have gotten low (anemic). The transfusion raises the level of the red blood cells which carry oxygen, and this usually causes an energy increase. The nurse administering the transfusion will monitor you carefully for complications. HOME CARE INSTRUCTIONS  No special instructions are needed after a transfusion. You may find your energy is better. Speak with your caregiver about any limitations on activity for underlying diseases you may have. SEEK MEDICAL CARE IF:  Your condition is not improving after your transfusion. You develop redness or irritation at the intravenous (IV) site. SEEK IMMEDIATE MEDICAL CARE IF:  Any of the following symptoms occur over the next 12 hours: Shaking chills. You have a temperature by mouth above 102 F (38.9 C), not controlled by medicine. Chest, back, or muscle pain. People around you feel  you are not acting correctly or are confused. Shortness of breath or difficulty breathing. Dizziness and fainting. You get a rash or develop hives. You have a decrease in urine output. Your urine turns a dark color or changes to pink, red, or brown. Any of the following symptoms occur over the next 10 days: You have a temperature by mouth above 102 F (38.9 C), not controlled by medicine. Shortness of breath. Weakness after normal activity. The white part of the eye turns yellow (jaundice). You have a decrease in the  amount of urine or are urinating less often. Your urine turns a dark color or changes to pink, red, or brown. Document Released: 11/30/2000 Document Revised: 02/25/2012 Document Reviewed: 07/19/2008 [facility] Patient Information 2014 [place], [place].         _______________________________________________________________________

## 2023-07-21 NOTE — Progress Notes (Signed)
COVID Vaccine received:  []  No [x]  Yes Date of any COVID positive Test in last 90 days:    none  PCP - Dr. Fleet Contras   clearance 05-09-23 on chart Cardiologist -  none  Chest x-ray - 07-27-2015  2v  CEW EKG -  no hx to warrant Stress Test -  ECHO -  Cardiac Cath -   PCR screen: [x]  Ordered & Completed           []   No Order but Needs PROFEND           []   N/A for this surgery  Surgery Plan:  []  Ambulatory                            [x]  Outpatient in bed                            []  Admit  Anesthesia:    []  General  []  Spinal                           [x]   Choice []   MAC  Pacemaker / ICD device [x]  No []  Yes   Spinal Cord Stimulator:[x]  No []  Yes       History of Sleep Apnea? []  No [x]  Yes   CPAP used?- []  No [x]  Yes    Does the patient monitor blood sugar?          []  No []  Yes  [x]  N/A  Patient has: [x]  NO Hx DM   []  Pre-DM                 []  DM1  []   DM2  Blood Thinner / Instructions: none Aspirin Instructions:   none  ERAS Protocol Ordered: []  No  [x]  Yes PRE-SURGERY [x]  ENSURE  []  G2  Patient is to be NPO after: 08:30  Comments: Patient was given the 5 CHG shower / bath instructions for THA  surgery along with 2 bottles of the CHG soap. Patient will start this on:  Thursday  08-01-2023    All questions were asked and answered, Patient voiced understanding of this process.   Activity level: Patient is able to climb a flight of stairs without difficulty; [x]  No CP  [x]  No SOB, but would have leg & back pain. Patient can perform ADLs without assistance.   Anesthesia review: OSA- CPAP, anemia, PONV, asthma,  CPS- mult. Back surgeries, Long term opiates  Patient denies shortness of breath, fever, cough and chest pain at PAT appointment.  Patient verbalized understanding and agreement to the Pre-Surgical Instructions that were given to them at this PAT appointment. Patient was also educated of the need to review these PAT instructions again prior to her surgery.I  reviewed the appropriate phone numbers to call if they have any and questions or concerns.

## 2023-07-23 ENCOUNTER — Other Ambulatory Visit: Payer: Self-pay

## 2023-07-23 ENCOUNTER — Encounter (HOSPITAL_COMMUNITY)
Admission: RE | Admit: 2023-07-23 | Discharge: 2023-07-23 | Disposition: A | Discharge status: Home / Self Care | Payer: Medicare HMO | Source: Ambulatory Visit | Attending: Orthopedic Surgery | Admitting: Orthopedic Surgery

## 2023-07-23 ENCOUNTER — Encounter (HOSPITAL_COMMUNITY): Payer: Self-pay

## 2023-07-23 VITALS — BP 127/90 | Temp 98.6°F | Resp 18 | Ht 63.0 in | Wt 122.0 lb

## 2023-07-23 DIAGNOSIS — Z01818 Encounter for other preprocedural examination: Secondary | ICD-10-CM

## 2023-07-23 DIAGNOSIS — Z01812 Encounter for preprocedural laboratory examination: Secondary | ICD-10-CM | POA: Insufficient documentation

## 2023-07-23 DIAGNOSIS — D649 Anemia, unspecified: Secondary | ICD-10-CM

## 2023-07-23 DIAGNOSIS — Z79891 Long term (current) use of opiate analgesic: Secondary | ICD-10-CM | POA: Insufficient documentation

## 2023-07-23 HISTORY — DX: Anxiety disorder, unspecified: F41.9

## 2023-07-23 HISTORY — DX: Sleep apnea, unspecified: G47.30

## 2023-07-23 HISTORY — DX: Unspecified osteoarthritis, unspecified site: M19.90

## 2023-07-23 LAB — CBC
HCT: 43.6 % (ref 36.0–46.0)
Hemoglobin: 13.9 g/dL (ref 12.0–15.0)
MCH: 29.3 pg (ref 26.0–34.0)
MCHC: 31.9 g/dL (ref 30.0–36.0)
MCV: 91.8 fL (ref 80.0–100.0)
Platelets: 376 10*3/uL (ref 150–400)
RBC: 4.75 MIL/uL (ref 3.87–5.11)
RDW: 12.8 % (ref 11.5–15.5)
WBC: 9.1 10*3/uL (ref 4.0–10.5)
nRBC: 0 % (ref 0.0–0.2)

## 2023-07-23 LAB — COMPREHENSIVE METABOLIC PANEL
ALT: 16 U/L (ref 0–44)
AST: 19 U/L (ref 15–41)
Albumin: 4.6 g/dL (ref 3.5–5.0)
Alkaline Phosphatase: 54 U/L (ref 38–126)
Anion gap: 10 (ref 5–15)
BUN: 10 mg/dL (ref 8–23)
CO2: 27 mmol/L (ref 22–32)
Calcium: 10 mg/dL (ref 8.9–10.3)
Chloride: 101 mmol/L (ref 98–111)
Creatinine, Ser: 0.63 mg/dL (ref 0.44–1.00)
GFR, Estimated: >60 mL/min (ref >60)
Glucose, Bld: 96 mg/dL (ref 70–99)
Potassium: 4.3 mmol/L (ref 3.5–5.1)
Sodium: 138 mmol/L (ref 135–145)
Total Bilirubin: 0.8 mg/dL (ref 0.3–1.2)
Total Protein: 7.9 g/dL (ref 6.5–8.1)

## 2023-07-23 LAB — TYPE AND SCREEN
ABO/RH(D): O POS
Antibody Screen: POSITIVE

## 2023-07-23 LAB — SURGICAL PCR SCREEN
MRSA, PCR: NEGATIVE
Staphylococcus aureus: NEGATIVE

## 2023-07-24 NOTE — Progress Notes (Signed)
Patient's T&S  is positive for antibodies. Appropriate notes have been placed on the patient's chart. This note has been routed to Dr. Lequita Halt and Eartha Inch for review. The Patient's surgery is currently scheduled for:  08-05-2023  at [facility]. I have placed the appropriate orders for a redraw on the DOS.   Rudean Haskell, BSN, CVRN-BC   Pre-Surgical Testing Nurse [facility]  [phone]

## 2023-07-29 NOTE — H&P (Signed)
TOTAL HIP ADMISSION H&P  Patient is admitted for left total hip arthroplasty.  Subjective:  Chief Complaint: Left hip pain  HPI: Emily Brock, 67 y.o. female, has a history of pain and functional disability in the left hip due to arthritis and patient has failed non-surgical conservative treatments for greater than 12 weeks to include corticosteriod injections and activity modification. Onset of symptoms was gradual, starting  several  years ago with gradually worsening course since that time. The patient noted no past surgery on the left hip. Patient currently rates pain in the left hip at 8 out of 10 with activity. Patient has night pain, trendelenberg gait, pain that interfers with activities of daily living, and pain with passive range of motion. Patient has evidence of  significant labral tear with secondary degenerative changes  by imaging studies. This condition presents safety issues increasing the risk of falls. There is no current active infection.  Patient Active Problem List   Diagnosis Date Noted   Spondylolisthesis at L4-L5 level 11/14/2021   Obstructive sleep apnea 01/18/2016    Past Medical History:  Diagnosis Date   Anemia    Anxiety    Arthritis    Asthma    Headache    PONV (postoperative nausea and vomiting)    Recurrent sinus infections    Sleep apnea    uses CPAP    Past Surgical History:  Procedure Laterality Date   BACK SURGERY     CHOLECYSTECTOMY     right foot surgery     SHOULDER ARTHROSCOPY Right    times 5   SINUS EXPLORATION     thoracic scapular infusion  12/18/2011    Prior to Admission medications   Medication Sig Start Date End Date Taking? Authorizing Provider  acetaminophen (TYLENOL) 500 MG tablet Take 1,000 mg by mouth every 8 (eight) hours as needed for headache or moderate pain.   Yes [provider]  albuterol (PROVENTIL HFA;VENTOLIN HFA) 108 (90 BASE) MCG/ACT inhaler Inhale 2 puffs into the lungs every 6 (six) hours as  needed.   Yes [provider]  alendronate (FOSAMAX) 70 MG tablet Take 70 mg by mouth once a week.   Yes [provider]  amoxicillin-clavulanate (AUGMENTIN) 875-125 MG tablet Take 1 tablet by mouth 2 (two) times daily.   Yes [provider]  Calcium Carbonate-Vitamin D (CALCIUM + D PO) Take 1 tablet by mouth daily.   Yes [provider]  hydrOXYzine (ATARAX) 25 MG tablet Take 25 mg by mouth 3 (three) times daily as needed for anxiety.   Yes [provider]  Misc Natural Products (GLUCOSAMINE CHOND COMPLEX/MSM) TABS Take 2 tablets by mouth daily.   Yes [provider]  Omega 3 1000 MG CAPS Take 1,000 mg by mouth daily.   Yes [provider]  omeprazole (PRILOSEC) 20 MG capsule Take 20 mg by mouth daily.   Yes [provider]  oxyCODONE (OXY IR/ROXICODONE) 5 MG immediate release tablet Take 5 mg by mouth every 8 (eight) hours as needed for severe pain. 01/12/16  Yes [provider]  polyethylene glycol powder (GLYCOLAX/MIRALAX) 17 GM/SCOOP powder Take 1 Container by mouth as needed.   Yes [provider]  Turmeric (CURCUMIN 95) 500 MG CAPS Take 500 mg by mouth 3 (three) times daily.   Yes [provider]  TURMERIC PO Take 2,250 mg by mouth 3 (three) times daily.   Yes [provider]  zolpidem (AMBIEN) 10 MG tablet Take 10  mg by mouth at bedtime.   Yes [provider]  methocarbamol (ROBAXIN) 500 MG tablet Take 1 tablet (500 mg total) by mouth every 6 (six) hours as needed for muscle spasms. Patient not taking: Reported on 07/23/2023 11/15/21   Barnett Abu, MD  oxyCODONE-acetaminophen (PERCOCET/ROXICET) 5-325 MG tablet Take 1-2 tablets by mouth every 4 (four) hours as needed for moderate pain or severe pain. Patient not taking: Reported on 07/23/2023 11/15/21   Barnett Abu, MD    Allergies  Allergen Reactions   Ciprofloxacin     Muscle cramps   Nsaids     Bleeding ulcer   Wound  Dressing Adhesive Rash    Social History   Socioeconomic History   Marital status: Single    Spouse name: Not on file   Number of children: Not on file   Years of education: Not on file   Highest education level: Not on file  Occupational History   Not on file  Tobacco Use   Smoking status: Never   Smokeless tobacco: Not on file  Vaping Use   Vaping status: Never Used  Substance and Sexual Activity   Alcohol use: No   Drug use: No   Sexual activity: Not Currently  Other Topics Concern   Not on file  Social History Narrative   Not on file   Social Determinants of Health   Financial Resource Strain: Not on file  Food Insecurity: Not on file  Transportation Needs: Not on file  Physical Activity: Not on file  Stress: Not on file  Social Connections: Not on file  Intimate Partner Violence: Not on file    Tobacco Use: Unknown (07/23/2023)   Patient History    Smoking Tobacco Use: Never    Smokeless Tobacco Use: Unknown    Passive Exposure: Not on file   Social History   Substance and Sexual Activity  Alcohol Use No    Family History  Problem Relation Age of Onset   Breast cancer Neg Hx     Review of Systems  Constitutional:  Negative for chills and fever.  HENT:  Negative for congestion, sore throat and tinnitus.   Eyes:  Negative for double vision, photophobia and pain.  Respiratory:  Negative for cough, shortness of breath and wheezing.   Cardiovascular:  Negative for chest pain, palpitations and orthopnea.  Gastrointestinal:  Negative for heartburn, nausea and vomiting.  Genitourinary:  Negative for dysuria, frequency and urgency.  Musculoskeletal:  Positive for joint pain.  Neurological:  Negative for dizziness, weakness and headaches.     Objective:  Physical Exam:  Well-developed female, alert, oriented, no apparent distress.    - Gait pattern is slightly antalgic on the left.    - Left hip flexion 120, about 30 of internal rotation, 20 of  external rotation, and 30 of abduction.    - No trochanteric tenderness noted.    - Some discomfort on provocative testing.   Imaging Review Plain radiographs demonstrate moderate degenerative joint disease of the left hip. The bone quality appears to be adequate for age and reported activity level.  Assessment/Plan:  End stage arthritis, left hip  The patient history, physical examination, clinical judgement of the provider and imaging studies are consistent with end stage degenerative joint disease of the left hip and total hip arthroplasty is deemed medically necessary. The treatment options including medical management, injection therapy, arthroscopy and arthroplasty were discussed at length. The risks and benefits of total hip arthroplasty were presented and reviewed.  The risks due to aseptic loosening, infection, stiffness, dislocation/subluxation, thromboembolic complications and other imponderables were discussed. The patient acknowledged the explanation, agreed to proceed with the plan and consent was signed. Patient is being admitted for inpatient treatment for surgery, pain control, PT, OT, prophylactic antibiotics, VTE prophylaxis, progressive ambulation and ADLs and discharge planning.The patient is planning to be discharged  home .   Patient's anticipated LOS is less than 2 midnights, meeting these requirements: - Younger than 74 - Lives within 1 hour of care - Has a competent adult at home to recover with post-op recover - NO history of  - Chronic pain requiring opiods  - Diabetes  - Coronary Artery Disease  - Heart failure  - Heart attack  - Stroke  - DVT/VTE  - Cardiac arrhythmia  - Respiratory Failure/COPD  - Renal failure  - Anemia  - Advanced Liver disease  Therapy Plans: HEP Disposition: WL overnight then home w/ Son or Friend unsure Planned DVT Prophylaxis: Xarelto 10 mg QD DME Needed: None PCP: Dr. Concepcion Elk (clearance received) TXA: IV Allergies: Cipro  (flu-like feeling), NSAIDs (hx GI bleed), Tetacycline (flu-like feeling) Anesthesia Concerns: Nausea BMI: 22 Last HgbA1c: Not Diabetic Pharmacy: [facility]  Other: Pain management: On Oxycodone 5 mg TID-QID Hx of gastric ulcer / GI bleed  - Patient was instructed on what medications to stop prior to surgery. - Follow-up visit in 2 weeks with Dr. Lequita Halt - Begin physical therapy following surgery - Pre-operative lab work as pre-surgical testing - Prescriptions will be provided in hospital at time of discharge  Arther Abbott, PA-C Orthopedic Surgery [facility] [place]

## 2023-08-05 ENCOUNTER — Ambulatory Visit (HOSPITAL_COMMUNITY): Payer: Medicare HMO

## 2023-08-05 ENCOUNTER — Other Ambulatory Visit: Payer: Self-pay

## 2023-08-05 ENCOUNTER — Ambulatory Visit (HOSPITAL_BASED_OUTPATIENT_CLINIC_OR_DEPARTMENT_OTHER): Payer: Medicare HMO

## 2023-08-05 ENCOUNTER — Encounter (HOSPITAL_COMMUNITY)
Admission: RE | Disposition: A | Discharge status: Home / Self Care | Payer: Self-pay | Source: Home / Self Care | Attending: Orthopedic Surgery

## 2023-08-05 ENCOUNTER — Observation Stay (HOSPITAL_COMMUNITY)
Admission: RE | Admit: 2023-08-05 | Discharge: 2023-08-06 | Disposition: A | Discharge status: Home / Self Care | Payer: Medicare HMO | Attending: Orthopedic Surgery | Admitting: Orthopedic Surgery

## 2023-08-05 ENCOUNTER — Encounter (HOSPITAL_COMMUNITY): Payer: Self-pay | Admitting: Orthopedic Surgery

## 2023-08-05 ENCOUNTER — Observation Stay (HOSPITAL_COMMUNITY): Payer: Medicare HMO

## 2023-08-05 DIAGNOSIS — J45909 Unspecified asthma, uncomplicated: Secondary | ICD-10-CM | POA: Insufficient documentation

## 2023-08-05 DIAGNOSIS — Z01818 Encounter for other preprocedural examination: Principal | ICD-10-CM

## 2023-08-05 DIAGNOSIS — M1612 Unilateral primary osteoarthritis, left hip: Secondary | ICD-10-CM | POA: Diagnosis present

## 2023-08-05 DIAGNOSIS — M169 Osteoarthritis of hip, unspecified: Secondary | ICD-10-CM | POA: Diagnosis present

## 2023-08-05 DIAGNOSIS — Z79899 Other long term (current) drug therapy: Secondary | ICD-10-CM | POA: Diagnosis not present

## 2023-08-05 HISTORY — PX: TOTAL HIP ARTHROPLASTY: SHX124

## 2023-08-05 LAB — BPAM RBC
Blood Product Expiration Date: 202409062359
Unit Type and Rh: 5100

## 2023-08-05 LAB — TYPE AND SCREEN

## 2023-08-05 SURGERY — ARTHROPLASTY, HIP, TOTAL, ANTERIOR APPROACH
Anesthesia: Spinal | Site: Hip | Laterality: Left

## 2023-08-05 MED ORDER — ALBUTEROL SULFATE (2.5 MG/3ML) 0.083% IN NEBU: 2.5000 mg | INHALATION_SOLUTION | Freq: Four times a day (QID) | RESPIRATORY_TRACT | Status: DC | PRN

## 2023-08-05 MED ORDER — POVIDONE-IODINE 10 % EX SWAB: 2.0000 | Freq: Once | CUTANEOUS | Status: DC

## 2023-08-05 MED ORDER — MIDAZOLAM HCL 2 MG/2ML IJ SOLN
INTRAMUSCULAR | Status: AC
  Filled 2023-08-05: qty 2

## 2023-08-05 MED ORDER — METOCLOPRAMIDE HCL 5 MG PO TABS: 5.0000 mg | ORAL_TABLET | Freq: Three times a day (TID) | ORAL | Status: DC | PRN

## 2023-08-05 MED ORDER — ACETAMINOPHEN 160 MG/5ML PO SOLN: 325.0000 mg | Freq: Once | ORAL | Status: DC | PRN

## 2023-08-05 MED ORDER — MIDAZOLAM HCL 5 MG/5ML IJ SOLN
INTRAMUSCULAR | Status: DC | PRN
  Administered 2023-08-05: 2 mg via INTRAVENOUS

## 2023-08-05 MED ORDER — PROPOFOL 1000 MG/100ML IV EMUL
INTRAVENOUS | Status: AC
  Filled 2023-08-05: qty 100

## 2023-08-05 MED ORDER — SODIUM CHLORIDE 0.9 % IV SOLN: INTRAVENOUS | Status: DC

## 2023-08-05 MED ORDER — HYDROMORPHONE HCL 1 MG/ML IJ SOLN
0.5000 mg | INTRAMUSCULAR | Status: DC | PRN
  Administered 2023-08-05 (×2): 0.5 mg via INTRAVENOUS
  Administered 2023-08-06: 1 mg via INTRAVENOUS
  Filled 2023-08-05 (×2): qty 1

## 2023-08-05 MED ORDER — ONDANSETRON HCL 4 MG/2ML IJ SOLN
4.0000 mg | Freq: Four times a day (QID) | INTRAMUSCULAR | Status: DC | PRN
  Administered 2023-08-06: 4 mg via INTRAVENOUS
  Filled 2023-08-05: qty 2

## 2023-08-05 MED ORDER — OXYCODONE HCL 5 MG PO TABS
5.0000 mg | ORAL_TABLET | ORAL | Status: DC | PRN
  Administered 2023-08-06: 10 mg via ORAL
  Administered 2023-08-06: 5 mg via ORAL
  Administered 2023-08-06: 10 mg via ORAL
  Filled 2023-08-05: qty 2
  Filled 2023-08-05: qty 1
  Filled 2023-08-05: qty 2

## 2023-08-05 MED ORDER — METHOCARBAMOL 500 MG IVPB - SIMPLE MED: 500.0000 mg | Freq: Four times a day (QID) | INTRAVENOUS | Status: DC | PRN

## 2023-08-05 MED ORDER — ACETAMINOPHEN 325 MG PO TABS
325.0000 mg | ORAL_TABLET | Freq: Four times a day (QID) | ORAL | Status: DC | PRN
  Administered 2023-08-06: 650 mg via ORAL
  Filled 2023-08-05: qty 2

## 2023-08-05 MED ORDER — CEFAZOLIN SODIUM-DEXTROSE 2-4 GM/100ML-% IV SOLN
2.0000 g | Freq: Four times a day (QID) | INTRAVENOUS | Status: AC
  Administered 2023-08-05 (×2): 2 g via INTRAVENOUS
  Filled 2023-08-05 (×2): qty 100

## 2023-08-05 MED ORDER — RIVAROXABAN 10 MG PO TABS
10.0000 mg | ORAL_TABLET | Freq: Every day | ORAL | Status: DC
  Administered 2023-08-06: 10 mg via ORAL
  Filled 2023-08-05: qty 1

## 2023-08-05 MED ORDER — PHENYLEPHRINE 80 MCG/ML (10ML) SYRINGE FOR IV PUSH (FOR BLOOD PRESSURE SUPPORT)
PREFILLED_SYRINGE | INTRAVENOUS | Status: DC | PRN
Start: 2023-08-05 — End: 2023-08-05
  Administered 2023-08-05: 160 ug via INTRAVENOUS
  Administered 2023-08-05 (×2): 80 ug via INTRAVENOUS
  Administered 2023-08-05: 160 ug via INTRAVENOUS

## 2023-08-05 MED ORDER — 0.9 % SODIUM CHLORIDE (POUR BTL) OPTIME
TOPICAL | Status: DC | PRN
  Administered 2023-08-05: 1000 mL

## 2023-08-05 MED ORDER — CHLORHEXIDINE GLUCONATE 0.12 % MT SOLN
15.0000 mL | Freq: Once | OROMUCOSAL | Status: AC
  Administered 2023-08-05: 15 mL via OROMUCOSAL

## 2023-08-05 MED ORDER — HYDROXYZINE HCL 25 MG PO TABS: 25.0000 mg | ORAL_TABLET | Freq: Three times a day (TID) | ORAL | Status: DC | PRN

## 2023-08-05 MED ORDER — DROPERIDOL 2.5 MG/ML IJ SOLN: 0.6250 mg | Freq: Once | INTRAMUSCULAR | Status: DC | PRN

## 2023-08-05 MED ORDER — HYDROMORPHONE HCL 1 MG/ML IJ SOLN: 0.2500 mg | INTRAMUSCULAR | Status: DC | PRN

## 2023-08-05 MED ORDER — DOCUSATE SODIUM 100 MG PO CAPS
100.0000 mg | ORAL_CAPSULE | Freq: Two times a day (BID) | ORAL | Status: DC
  Filled 2023-08-05 (×2): qty 1

## 2023-08-05 MED ORDER — ONDANSETRON HCL 4 MG/2ML IJ SOLN
INTRAMUSCULAR | Status: DC | PRN
  Administered 2023-08-05: 4 mg via INTRAVENOUS

## 2023-08-05 MED ORDER — BUPIVACAINE-EPINEPHRINE (PF) 0.25% -1:200000 IJ SOLN
INTRAMUSCULAR | Status: DC | PRN
  Administered 2023-08-05: 30 mL

## 2023-08-05 MED ORDER — MENTHOL 3 MG MT LOZG: 1.0000 | LOZENGE | OROMUCOSAL | Status: DC | PRN

## 2023-08-05 MED ORDER — ACETAMINOPHEN 10 MG/ML IV SOLN: 1000.0000 mg | Freq: Once | INTRAVENOUS | Status: DC | PRN

## 2023-08-05 MED ORDER — ONDANSETRON HCL 4 MG PO TABS: 4.0000 mg | ORAL_TABLET | Freq: Four times a day (QID) | ORAL | Status: DC | PRN

## 2023-08-05 MED ORDER — TRAMADOL HCL 50 MG PO TABS
50.0000 mg | ORAL_TABLET | Freq: Four times a day (QID) | ORAL | Status: DC
  Administered 2023-08-05 – 2023-08-06 (×2): 50 mg via ORAL
  Filled 2023-08-05 (×2): qty 1

## 2023-08-05 MED ORDER — POLYETHYLENE GLYCOL 3350 17 G PO PACK: 17.0000 g | PACK | Freq: Every day | ORAL | Status: DC | PRN

## 2023-08-05 MED ORDER — LACTATED RINGERS IV SOLN: INTRAVENOUS | Status: DC

## 2023-08-05 MED ORDER — PHENYLEPHRINE 80 MCG/ML (10ML) SYRINGE FOR IV PUSH (FOR BLOOD PRESSURE SUPPORT)
PREFILLED_SYRINGE | INTRAVENOUS | Status: AC
  Filled 2023-08-05: qty 10

## 2023-08-05 MED ORDER — ORAL CARE MOUTH RINSE: 15.0000 mL | Freq: Once | OROMUCOSAL | Status: AC

## 2023-08-05 MED ORDER — MAGNESIUM CITRATE PO SOLN: 1.0000 | Freq: Once | ORAL | Status: DC | PRN

## 2023-08-05 MED ORDER — DEXAMETHASONE SODIUM PHOSPHATE 10 MG/ML IJ SOLN
INTRAMUSCULAR | Status: AC
  Filled 2023-08-05: qty 1

## 2023-08-05 MED ORDER — PHENOL 1.4 % MT LIQD: 1.0000 | OROMUCOSAL | Status: DC | PRN

## 2023-08-05 MED ORDER — BUPIVACAINE IN DEXTROSE 0.75-8.25 % IT SOLN
INTRATHECAL | Status: DC | PRN
  Administered 2023-08-05: 1.7 mL via INTRATHECAL

## 2023-08-05 MED ORDER — WATER FOR IRRIGATION, STERILE IR SOLN
Status: DC | PRN
  Administered 2023-08-05: 2000 mL

## 2023-08-05 MED ORDER — ONDANSETRON HCL 4 MG/2ML IJ SOLN
INTRAMUSCULAR | Status: AC
  Filled 2023-08-05: qty 2

## 2023-08-05 MED ORDER — DEXAMETHASONE SODIUM PHOSPHATE 10 MG/ML IJ SOLN
8.0000 mg | Freq: Once | INTRAMUSCULAR | Status: AC
  Administered 2023-08-05: 4 mg via INTRAVENOUS

## 2023-08-05 MED ORDER — PROPOFOL 10 MG/ML IV BOLUS
INTRAVENOUS | Status: DC | PRN
  Administered 2023-08-05: 20 mg via INTRAVENOUS
  Administered 2023-08-05: 30 mg via INTRAVENOUS
  Administered 2023-08-05: 10 mg via INTRAVENOUS
  Administered 2023-08-05 (×2): 20 mg via INTRAVENOUS

## 2023-08-05 MED ORDER — PROMETHAZINE HCL 25 MG/ML IJ SOLN: 6.2500 mg | INTRAMUSCULAR | Status: DC | PRN

## 2023-08-05 MED ORDER — METHOCARBAMOL 500 MG PO TABS
500.0000 mg | ORAL_TABLET | Freq: Four times a day (QID) | ORAL | Status: DC | PRN
  Administered 2023-08-05 – 2023-08-06 (×2): 500 mg via ORAL
  Filled 2023-08-05 (×3): qty 1

## 2023-08-05 MED ORDER — OXYCODONE HCL 5 MG PO TABS
10.0000 mg | ORAL_TABLET | ORAL | Status: DC | PRN
  Administered 2023-08-05: 10 mg via ORAL
  Administered 2023-08-05 – 2023-08-06 (×3): 15 mg via ORAL
  Filled 2023-08-05 (×4): qty 3

## 2023-08-05 MED ORDER — BISACODYL 10 MG RE SUPP: 10.0000 mg | Freq: Every day | RECTAL | Status: DC | PRN

## 2023-08-05 MED ORDER — ACETAMINOPHEN 325 MG PO TABS: 325.0000 mg | ORAL_TABLET | Freq: Once | ORAL | Status: DC | PRN

## 2023-08-05 MED ORDER — DEXAMETHASONE SODIUM PHOSPHATE 10 MG/ML IJ SOLN
10.0000 mg | Freq: Once | INTRAMUSCULAR | Status: AC
  Administered 2023-08-06: 10 mg via INTRAVENOUS
  Filled 2023-08-05: qty 1

## 2023-08-05 MED ORDER — METOCLOPRAMIDE HCL 5 MG/ML IJ SOLN: 5.0000 mg | Freq: Three times a day (TID) | INTRAMUSCULAR | Status: DC | PRN

## 2023-08-05 MED ORDER — BUPIVACAINE-EPINEPHRINE 0.25% -1:200000 IJ SOLN
INTRAMUSCULAR | Status: AC
  Filled 2023-08-05: qty 1

## 2023-08-05 MED ORDER — PROPOFOL 10 MG/ML IV BOLUS
INTRAVENOUS | Status: AC
  Filled 2023-08-05: qty 20

## 2023-08-05 MED ORDER — CEFAZOLIN SODIUM-DEXTROSE 2-4 GM/100ML-% IV SOLN
2.0000 g | INTRAVENOUS | Status: AC
  Administered 2023-08-05: 2 g via INTRAVENOUS
  Filled 2023-08-05: qty 100

## 2023-08-05 MED ORDER — TRANEXAMIC ACID-NACL 1000-0.7 MG/100ML-% IV SOLN
1000.0000 mg | INTRAVENOUS | Status: AC
  Administered 2023-08-05: 1000 mg via INTRAVENOUS
  Filled 2023-08-05: qty 100

## 2023-08-05 MED ORDER — PROPOFOL 500 MG/50ML IV EMUL
INTRAVENOUS | Status: DC | PRN
  Administered 2023-08-05: 50 ug/kg/min via INTRAVENOUS

## 2023-08-05 MED ORDER — ACETAMINOPHEN 10 MG/ML IV SOLN
1000.0000 mg | Freq: Four times a day (QID) | INTRAVENOUS | Status: DC
  Administered 2023-08-05: 1000 mg via INTRAVENOUS
  Filled 2023-08-05: qty 100

## 2023-08-05 SURGICAL SUPPLY — 35 items
ADH SKN CLS APL DERMABOND .7 (GAUZE/BANDAGES/DRESSINGS) ×1
BLADE SAG 18X100X1.27 (BLADE) ×1 IMPLANT
COVER PERINEAL POST (MISCELLANEOUS) ×1 IMPLANT
COVER SURGICAL LIGHT HANDLE (MISCELLANEOUS) ×1 IMPLANT
CUP ACETBLR 48 OD SECTOR II (Hips) IMPLANT
DERMABOND ADVANCED .7 DNX12 (GAUZE/BANDAGES/DRESSINGS) ×1 IMPLANT
DRAPE FOOT SWITCH (DRAPES) ×1 IMPLANT
DRAPE STERI IOBAN 125X83 (DRAPES) ×1 IMPLANT
DRAPE U-SHAPE 47X51 STRL (DRAPES) ×2 IMPLANT
DRSG AQUACEL AG ADV 3.5X10 (GAUZE/BANDAGES/DRESSINGS) ×1 IMPLANT
DRSG MEPILEX POST OP 4X8 (GAUZE/BANDAGES/DRESSINGS) IMPLANT
DURAPREP 26ML APPLICATOR (WOUND CARE) ×1 IMPLANT
ELECT REM PT RETURN 15FT ADLT (MISCELLANEOUS) ×1 IMPLANT
GLOVE BIO SURGEON STRL SZ 6.5 (GLOVE) IMPLANT
GLOVE BIO SURGEON STRL SZ8 (GLOVE) ×1 IMPLANT
GLOVE BIOGEL PI IND STRL 7.0 (GLOVE) IMPLANT
GLOVE BIOGEL PI IND STRL 8 (GLOVE) ×1 IMPLANT
GOWN STRL REUS W/ TWL LRG LVL3 (GOWN DISPOSABLE) ×1 IMPLANT
GOWN STRL REUS W/TWL LRG LVL3 (GOWN DISPOSABLE) ×2
HEAD CERAMIC DELTA 28M 12/14P5 (Head) IMPLANT
HOLDER FOLEY CATH W/STRAP (MISCELLANEOUS) ×1 IMPLANT
KIT TURNOVER KIT A (KITS) IMPLANT
LINER MARATHON 28 48 (Hips) IMPLANT
MANIFOLD NEPTUNE II (INSTRUMENTS) ×1 IMPLANT
PACK ANTERIOR HIP CUSTOM (KITS) ×1 IMPLANT
PENCIL SMOKE EVACUATOR COATED (MISCELLANEOUS) ×1 IMPLANT
STEM FEMORAL SZ 6MM STD ACTIS (Stem) IMPLANT
SUT ETHIBOND NAB CT1 #1 30IN (SUTURE) ×1 IMPLANT
SUT MNCRL AB 4-0 PS2 18 (SUTURE) ×1 IMPLANT
SUT STRATAFIX 0 PDS 27 VIOLET (SUTURE) ×1
SUT VIC AB 2-0 CT1 27 (SUTURE) ×2
SUT VIC AB 2-0 CT1 TAPERPNT 27 (SUTURE) ×2 IMPLANT
SUTURE STRATFX 0 PDS 27 VIOLET (SUTURE) ×1 IMPLANT
TRAY FOLEY MTR SLVR 16FR STAT (SET/KITS/TRAYS/PACK) ×1 IMPLANT
TUBE SUCTION HIGH CAP CLEAR NV (SUCTIONS) ×1 IMPLANT

## 2023-08-05 NOTE — Anesthesia Postprocedure Evaluation (Signed)
Anesthesia Post Note  Patient: Emily Brock  Procedure(s) Performed: TOTAL HIP ARTHROPLASTY ANTERIOR APPROACH (Left: Hip)     Patient location during evaluation: PACU Anesthesia Type: Spinal Level of consciousness: oriented and awake and alert Pain management: pain level controlled Vital Signs Assessment: post-procedure vital signs reviewed and stable Respiratory status: spontaneous breathing, respiratory function stable and patient connected to nasal cannula oxygen Cardiovascular status: blood pressure returned to baseline and stable Postop Assessment: no headache, no backache, no apparent nausea or vomiting and spinal receding Anesthetic complications: no  No notable events documented.  Last Vitals:  Vitals:   08/05/23 1445 08/05/23 1455  BP: 103/73 130/73  Pulse: 82 87  Resp: 16 17  Temp:  36.7 C  SpO2: 100% 97%    Last Pain:  Vitals:   08/05/23 1539  TempSrc:   PainSc: 7                  Shelton Silvas

## 2023-08-05 NOTE — Op Note (Signed)
OPERATIVE REPORT- TOTAL HIP ARTHROPLASTY   PREOPERATIVE DIAGNOSIS: Osteoarthritis of the Left hip.   POSTOPERATIVE DIAGNOSIS: Osteoarthritis of the Left  hip.   PROCEDURE: Left total hip arthroplasty, anterior approach.   SURGEON: Ollen Gross, MD   ASSISTANT: Arther Abbott, PA-C  ANESTHESIA:  Spinal  ESTIMATED BLOOD LOSS:-500 mL    DRAINS: None  COMPLICATIONS: None   CONDITION: PACU - hemodynamically stable.   BRIEF CLINICAL NOTE: Emily Brock is a 67 y.o. female who has advanced end-  stage arthritis of their Left  hip with progressively worsening pain and  dysfunction.The patient has failed nonoperative management and presents for  total hip arthroplasty.   PROCEDURE IN DETAIL: After successful administration of spinal  anesthetic, the traction boots for the [facility] bed were placed on both  feet and the patient was placed onto the [facility] bed, boots placed into the leg  holders. The Left hip was then isolated from the perineum with plastic  drapes and prepped and draped in the usual sterile fashion. ASIS and  greater trochanter were marked and a oblique incision was made, starting  at about 1 cm lateral and 2 cm distal to the ASIS and coursing towards  the anterior cortex of the femur. The skin was cut with a 10 blade  through subcutaneous tissue to the level of the fascia overlying the  tensor fascia lata muscle. The fascia was then incised in line with the  incision at the junction of the anterior third and posterior 2/3rd. The  muscle was teased off the fascia and then the interval between the TFL  and the rectus was developed. The Hohmann retractor was then placed at  the top of the femoral neck over the capsule. The vessels overlying the  capsule were cauterized and the fat on top of the capsule was removed.  A Hohmann retractor was then placed anterior underneath the rectus  femoris to give exposure to the entire anterior capsule. A T-shaped  capsulotomy  was performed. The edges were tagged and the femoral head  was identified.       Osteophytes are removed off the superior acetabulum.  The femoral neck was then cut in situ with an oscillating saw. Traction  was then applied to the left lower extremity utilizing the [facility]  traction. The femoral head was then removed. Retractors were placed  around the acetabulum and then circumferential removal of the labrum was  performed. Osteophytes were also removed. Reaming starts at 45 mm to  medialize and  Increased in 2 mm increments to 47 mm. We reamed in  approximately 40 degrees of abduction, 20 degrees anteversion. A 48 mm  pinnacle acetabular shell was then impacted in anatomic position under  fluoroscopic guidance with excellent purchase. We did not need to place  any additional dome screws. A 28 mm neutral + 4 marathon liner was then  placed into the acetabular shell.       The femoral lift was then placed along the lateral aspect of the femur  just distal to the vastus ridge. The leg was  externally rotated and capsule  was stripped off the inferior aspect of the femoral neck down to the  level of the lesser trochanter, this was done with electrocautery. The femur was lifted after this was performed. The  leg was then placed in an extended and adducted position essentially delivering the femur. We also removed the capsule superiorly and the piriformis from the piriformis fossa to  gain excellent exposure of the  proximal femur. Rongeur was used to remove some cancellous bone to get  into the lateral portion of the proximal femur for placement of the  initial starter reamer. The starter broaches was placed  the starter broach  and was shown to go down the center of the canal. Broaching  with the Actis system was then performed starting at size 0  coursing  Up to size 6. A size 6 had excellent torsional and rotational  and axial stability. The trial standard offset neck was then placed  with a 28  + 5 trial head. The hip was then reduced. We confirmed that  the stem was in the canal both on AP and lateral x-rays. It also has excellent sizing. The hip was reduced with outstanding stability through full extension and full external rotation.. AP pelvis was taken and the leg lengths were measured and found to be equal. Hip was then dislocated again and the femoral head and neck removed. The  femoral broach was removed. Size 6 Actis stem with a standard offset  neck was then impacted into the femur following native anteversion. Has  excellent purchase in the canal. Excellent torsional and rotational and  axial stability. It is confirmed to be in the canal on AP and lateral  fluoroscopic views. The 28 + 5 ceramic head was placed and the hip  reduced with outstanding stability. Again AP pelvis was taken and it  confirmed that the leg lengths were equal. The wound was then copiously  irrigated with saline solution and the capsule reattached and repaired  with Ethibond suture. 30 ml of .25% Bupivicaine was  injected into the capsule and into the edge of the tensor fascia lata as well as subcutaneous tissue. The fascia overlying the tensor fascia lata was then closed with a running #1 V-Loc. Subcu was closed with interrupted 2-0 Vicryl and subcuticular running 4-0 Monocryl. Incision was cleaned  and dried. Steri-Strips and a bulky sterile dressing applied. The patient was awakened and transported to  recovery in stable condition.        Please note that a surgical assistant was a medical necessity for this procedure to perform it in a safe and expeditious manner. Assistant was necessary to provide appropriate retraction of vital neurovascular structures and to prevent femoral fracture and allow for anatomic placement of the prosthesis.  Ollen Gross, M.D.

## 2023-08-05 NOTE — Evaluation (Addendum)
Physical Therapy Evaluation Patient Details Name: Emily Brock MRN: [id number] DOB: [birth year] Today's Date: 08/05/2023  History of Present Illness  67 yo female presents to therapy s/p L THA, anterior approach on 08/05/2023 due to failure of conservative measures. Pt PMH includes but is not limited to: spondylolisthesis at L4-L5, s/p back sx/PILF, OSA on CPAP, anemia, anxiety, arthritis, asthma, and thoracic scapular infusion.  Clinical Impression    Emily Brock is a 67 y.o. female POD 0 s/p L THA. Patient reports IND with mobility at baseline. Patient is now limited by functional impairments (see PT problem list below) and requires min A for bed mobility and CGA for transfers. Patient was able to ambulate 40 feet with RW and CGA level of assist. Patient instructed in exercise to facilitate ROM and circulation to manage edema. Pt indicated 8/10 LLE pain nurse provided pt with 0.5 mg dilaudid at the beginning of the evaluation and the remaining dose at end with pt indicating pain medication had limited impact on pain. Patient will benefit from continued skilled PT interventions to address impairments and progress towards PLOF. Acute PT will follow to progress mobility and stair training in preparation for safe discharge home with support from significant other/social support and HEP.       If plan is discharge home, recommend the following: A little help with walking and/or transfers;A little help with bathing/dressing/bathroom;Assistance with cooking/housework;Assist for transportation;Help with stairs or ramp for entrance   Can travel by private vehicle        Equipment Recommendations None recommended by PT  Recommendations for Other Services       Functional Status Assessment Patient has had a recent decline in their functional status and demonstrates the ability to make significant improvements in function in a reasonable and predictable amount of time.     Precautions / Restrictions  Precautions Precautions: Fall Restrictions Weight Bearing Restrictions: Yes LLE Weight Bearing: Weight bearing as tolerated      Mobility  Bed Mobility Overal bed mobility: Needs Assistance Bed Mobility: Supine to Sit     Supine to sit: Min assist, HOB elevated, Used rails     General bed mobility comments: cues increased time and min A for LLE    Transfers Overall transfer level: Needs assistance Equipment used: Rolling walker (2 wheels) Transfers: Sit to/from Stand Sit to Stand: Contact guard assist, From elevated surface           General transfer comment: cues for proper UE placement    Ambulation/Gait Ambulation/Gait assistance: Contact guard assist Gait Distance (Feet): 40 Feet Assistive device: Rolling walker (2 wheels) Gait Pattern/deviations: Step-to pattern, Antalgic, Trunk flexed Gait velocity: decreased     General Gait Details: reliance on B UE support at RW to offload LLE in stance phase cues for posture  Stairs            Wheelchair Mobility     Tilt Bed    Modified Rankin (Stroke Patients Only)       Balance Overall balance assessment: Needs assistance Sitting-balance support: Feet supported Sitting balance-Leahy Scale: Good     Standing balance support: Bilateral upper extremity supported, During functional activity, Reliant on assistive device for balance Standing balance-Leahy Scale: Poor                               Pertinent Vitals/Pain Pain Assessment Pain Assessment: 0-10 Pain Score: 8  Pain Location: L hip Pain  Descriptors / Indicators: Aching, Constant, Discomfort, Operative site guarding Pain Intervention(s): Limited activity within patient's tolerance, Monitored during session, Premedicated before session, Repositioned, Patient requesting pain meds-RN notified, RN gave pain meds during session, Ice applied    Home Living Family/patient expects to be discharged to:: Private residence Living  Arrangements: Spouse/significant other Available Help at Discharge: Family Type of Home: House Home Access: Stairs to enter Entrance Stairs-Rails: None Entrance Stairs-Number of Steps: 2   Home Layout: One level Home Equipment: Agricultural consultant (2 wheels);Rollator (4 wheels);Cane - single point;Shower seat      Prior Function Prior Level of Function : Independent/Modified Independent             Mobility Comments: IND no AD for all ADLs, self care tasks and IADLs       Extremity/Trunk Assessment        Lower Extremity Assessment Lower Extremity Assessment: LLE deficits/detail LLE Deficits / Details: ankle DF/PF 5/5 LLE Sensation: history of peripheral neuropathy (toes, PLOF)    Cervical / Trunk Assessment Cervical / Trunk Assessment: Back Surgery (wfl)  Communication   Communication Communication: No apparent difficulties  Cognition Arousal: Alert Behavior During Therapy: WFL for tasks assessed/performed Overall Cognitive Status: Within Functional Limits for tasks assessed                                          General Comments      Exercises Total Joint Exercises Ankle Circles/Pumps: AROM, Both, 20 reps   Assessment/Plan    PT Assessment Patient needs continued PT services  PT Problem List Decreased strength;Decreased activity tolerance;Decreased balance;Decreased mobility;Decreased coordination;Pain       PT Treatment Interventions DME instruction;Gait training;Stair training;Functional mobility training;Therapeutic activities;Therapeutic exercise;Balance training;Neuromuscular re-education;Patient/family education;Modalities    PT Goals (Current goals can be found in the Care Plan section)  Acute Rehab PT Goals Patient Stated Goal: to get home ASAP PT Goal Formulation: With patient Time For Goal Achievement: 08/19/23 Potential to Achieve Goals: Good    Frequency 7X/week     Co-evaluation               AM-PAC PT "6  Clicks" Mobility  Outcome Measure Help needed turning from your back to your side while in a flat bed without using bedrails?: A Little Help needed moving from lying on your back to sitting on the side of a flat bed without using bedrails?: A Little Help needed moving to and from a bed to a chair (including a wheelchair)?: A Little Help needed standing up from a chair using your arms (e.g., wheelchair or bedside chair)?: A Little Help needed to walk in hospital room?: A Little Help needed climbing 3-5 steps with a railing? : Total 6 Click Score: 16    End of Session Equipment Utilized During Treatment: Gait belt Activity Tolerance: Patient limited by pain Patient left: in chair;with call bell/phone within reach;with nursing/sitter in room Nurse Communication: Mobility status;Patient requests pain meds PT Visit Diagnosis: Unsteadiness on feet (R26.81);Other abnormalities of gait and mobility (R26.89);Muscle weakness (generalized) (M62.81);Pain Pain - Right/Left: Left Pain - part of body: Hip;Leg    Time: 0454-0981 PT Time Calculation (min) (ACUTE ONLY): 23 min   Charges:   PT Evaluation $PT Eval Low Complexity: 1 Low PT Treatments $Gait Training: 8-22 mins PT General Charges $$ ACUTE PT VISIT: 1 Visit         Johnny Bridge,  PT Acute Rehab   Jacqualyn Posey 08/05/2023, 6:05 PM

## 2023-08-05 NOTE — Transfer of Care (Signed)
Immediate Anesthesia Transfer of Care Note  Patient: Emily Brock  Procedure(s) Performed: TOTAL HIP ARTHROPLASTY ANTERIOR APPROACH (Left: Hip)  Patient Location: PACU  Anesthesia Type:Spinal  Level of Consciousness: awake, alert , and oriented  Airway & Oxygen Therapy: Patient Spontanous Breathing  Post-op Assessment: Report given to RN and Post -op Vital signs reviewed and stable  Post vital signs: Reviewed and stable  Last Vitals:  Vitals Value Taken Time  BP 94/60 08/05/23 1315  Temp    Pulse 88 08/05/23 1316  Resp 21 08/05/23 1316  SpO2 100 % 08/05/23 1316  Vitals shown include unfiled device data.  Last Pain:  Vitals:   08/05/23 0944  TempSrc: Oral  PainSc:       Patients Stated Pain Goal: 5 (08/05/23 0933)  Complications: No notable events documented.

## 2023-08-05 NOTE — Progress Notes (Signed)
   08/05/23 1747  Assess: MEWS Score  Temp 98.8 F (37.1 C)  BP 113/70  MAP (mmHg) 82  Pulse Rate (!) 113  Resp 16  SpO2 97 %  O2 Device Room Air  Patient Activity (if Appropriate) In chair  Assess: MEWS Score  MEWS Temp 0  MEWS Systolic 0  MEWS Pulse 2  MEWS RR 0  MEWS LOC 0  MEWS Score 2  MEWS Score Color Yellow  Assess: if the MEWS score is Yellow or Red  Were vital signs accurate and taken at a resting state? No, vital signs rechecked  Does the patient meet 2 or more of the SIRS criteria? No  Notify: Charge Nurse/RN  Name of Charge Nurse/RN Notified Amy, RN  Assess: SIRS CRITERIA  SIRS Temperature  0  SIRS Pulse 1  SIRS Respirations  0  SIRS WBC 0  SIRS Score Sum  1   Pt is sitting up in the chair, eating dinner and complaining of 8/10 pain to L hip. Pt is alert, on room air, denies chest pain, no SOB and not in any distress. Pt states her HR is elevated at times and she feels fine.

## 2023-08-05 NOTE — Discharge Instructions (Signed)
Emily Gross, MD Total Joint Specialist [facility] [address] [place], [place] [address] [phone]  ANTERIOR APPROACH TOTAL HIP REPLACEMENT POSTOPERATIVE DIRECTIONS     Hip Rehabilitation, Guidelines Following Surgery  The results of a hip operation are greatly improved after range of motion and muscle strengthening exercises. Follow all safety measures which are given to protect your hip. If any of these exercises cause increased pain or swelling in your joint, decrease the amount until you are comfortable again. Then slowly increase the exercises. Call your caregiver if you have problems or questions.   BLOOD CLOT PREVENTION Take a 10 mg Xarelto once a day for three weeks following surgery.  You may resume your vitamins/supplements once you have discontinued the Xarelto.   HOME CARE INSTRUCTIONS  Remove items at home which could result in a fall. This includes throw rugs or furniture in walking pathways.  ICE to the affected hip as frequently as 20-30 minutes an hour and then as needed for pain and swelling. Continue to use ice on the hip for pain and swelling from surgery. You may notice swelling that will progress down to the foot and ankle. This is normal after surgery. Elevate the leg when you are not up walking on it.   Continue to use the breathing machine which will help keep your temperature down.  It is common for your temperature to cycle up and down following surgery, especially at night when you are not up moving around and exerting yourself.  The breathing machine keeps your lungs expanded and your temperature down.  DIET You may resume your previous home diet once your are discharged from the hospital.  DRESSING / WOUND CARE / SHOWERING You have an adhesive waterproof bandage over the incision. Leave this in place until your first follow-up appointment. Once you remove this you will not need to place another bandage.  You may begin  showering 3 days following surgery, but do not submerge the incision under water.  ACTIVITY For the first 3-5 days, it is important to rest and keep the operative leg elevated. You should, as a general rule, rest for 50 minutes and walk/stretch for 10 minutes per hour. After 5 days, you may slowly increase activity as tolerated.  Perform the exercises you were provided twice a day for about 15-20 minutes each session. Begin these 2 days following surgery. Walk with your walker as instructed. Use the walker until you are comfortable transitioning to a cane. Walk with the cane in the opposite hand of the operative leg. You may discontinue the cane once you are comfortable and walking steadily. Avoid periods of inactivity such as sitting longer than an hour when not asleep. This helps prevent blood clots.  Do not drive a car for 6 weeks or until released by your surgeon.  Do not drive while taking narcotics.  TED HOSE STOCKINGS Wear the elastic stockings on both legs for three weeks following surgery during the day. You may remove them at night while sleeping.  WEIGHT BEARING Weight bearing as tolerated with assist device (walker, cane, etc) as directed, use it as long as suggested by your surgeon or therapist, typically at least 4-6 weeks.  POSTOPERATIVE CONSTIPATION PROTOCOL Constipation - defined medically as fewer than three stools per week and severe constipation as less than one stool per week.  One of the most common issues patients have following surgery is constipation.  Even if you have a regular bowel pattern at home, your  normal regimen is likely to be disrupted due to multiple reasons following surgery.  Combination of anesthesia, postoperative narcotics, change in appetite and fluid intake all can affect your bowels.  In order to avoid complications following surgery, here are some recommendations in order to help you during your recovery period.  Colace (docusate) - Pick up an  over-the-counter form of Colace or another stool softener and take twice a day as long as you are requiring postoperative pain medications.  Take with a full glass of water daily.  If you experience loose stools or diarrhea, hold the colace until you stool forms back up.  If your symptoms do not get better within 1 week or if they get worse, check with your doctor. Dulcolax (bisacodyl) - Pick up over-the-counter and take as directed by the product packaging as needed to assist with the movement of your bowels.  Take with a full glass of water.  Use this product as needed if not relieved by Colace only.  MiraLax (polyethylene glycol) - Pick up over-the-counter to have on hand.  MiraLax is a solution that will increase the amount of water in your bowels to assist with bowel movements.  Take as directed and can mix with a glass of water, juice, soda, coffee, or tea.  Take if you go more than two days without a movement.Do not use MiraLax more than once per day. Call your doctor if you are still constipated or irregular after using this medication for 7 days in a row.  If you continue to have problems with postoperative constipation, please contact the office for further assistance and recommendations.  If you experience "the worst abdominal pain ever" or develop nausea or vomiting, please contact the office immediatly for further recommendations for treatment.  ITCHING  If you experience itching with your medications, try taking only a single pain pill, or even half a pain pill at a time.  You can also use Benadryl over the counter for itching or also to help with sleep.   MEDICATIONS See your medication summary on the "After Visit Summary" that the nursing staff will review with you prior to discharge.  You may have some home medications which will be placed on hold until you complete the course of blood thinner medication.  It is important for you to complete the blood thinner medication as prescribed by  your surgeon.  Continue your approved medications as instructed at time of discharge.  PRECAUTIONS If you experience chest pain or shortness of breath - call 911 immediately for transfer to the hospital emergency department.  If you develop a fever greater that 101 F, purulent drainage from wound, increased redness or drainage from wound, foul odor from the wound/dressing, or calf pain - CONTACT YOUR SURGEON.                                                   FOLLOW-UP APPOINTMENTS Make sure you keep all of your appointments after your operation with your surgeon and caregivers. You should call the office at the above phone number and make an appointment for approximately two weeks after the date of your surgery or on the date instructed by your surgeon outlined in the "After Visit Summary".  RANGE OF MOTION AND STRENGTHENING EXERCISES  These exercises are designed to help you keep full movement of your  hip joint. Follow your caregiver's or physical therapist's instructions. Perform all exercises about fifteen times, three times per day or as directed. Exercise both hips, even if you have had only one joint replacement. These exercises can be done on a training (exercise) mat, on the floor, on a table or on a bed. Use whatever works the best and is most comfortable for you. Use music or television while you are exercising so that the exercises are a pleasant break in your day. This will make your life better with the exercises acting as a break in routine you can look forward to.  Lying on your back, slowly slide your foot toward your buttocks, raising your knee up off the floor. Then slowly slide your foot back down until your leg is straight again.  Lying on your back spread your legs as far apart as you can without causing discomfort.  Lying on your side, raise your upper leg and foot straight up from the floor as far as is comfortable. Slowly lower the leg and repeat.  Lying on your back, tighten up  the muscle in the front of your thigh (quadriceps muscles). You can do this by keeping your leg straight and trying to raise your heel off the floor. This helps strengthen the largest muscle supporting your knee.  Lying on your back, tighten up the muscles of your buttocks both with the legs straight and with the knee bent at a comfortable angle while keeping your heel on the floor.   POST-OPERATIVE OPIOID TAPER INSTRUCTIONS: It is important to wean off of your opioid medication as soon as possible. If you do not need pain medication after your surgery it is ok to stop day one. Opioids include: Codeine, Hydrocodone(Norco, Vicodin), Oxycodone(Percocet, oxycontin) and hydromorphone amongst others.  Long term and even short term use of opiods can cause: Increased pain response Dependence Constipation Depression Respiratory depression And more.  Withdrawal symptoms can include Flu like symptoms Nausea, vomiting And more Techniques to manage these symptoms Hydrate well Eat regular healthy meals Stay active Use relaxation techniques(deep breathing, meditating, yoga) Do Not substitute Alcohol to help with tapering If you have been on opioids for less than two weeks and do not have pain than it is ok to stop all together.  Plan to wean off of opioids This plan should start within one week post op of your joint replacement. Maintain the same interval or time between taking each dose and first decrease the dose.  Cut the total daily intake of opioids by one tablet each day Next start to increase the time between doses. The last dose that should be eliminated is the evening dose.   IF YOU ARE TRANSFERRED TO A SKILLED REHAB FACILITY If the patient is transferred to a skilled rehab facility following release from the hospital, a list of the current medications will be sent to the facility for the patient to continue.  When discharged from the skilled rehab facility, please have the facility set  up the patient's Home Health Physical Therapy prior to being released. Also, the skilled facility will be responsible for providing the patient with their medications at time of release from the facility to include their pain medication, the muscle relaxants, and their blood thinner medication. If the patient is still at the rehab facility at time of the two week follow up appointment, the skilled rehab facility will also need to assist the patient in arranging follow up appointment in our office and any transportation  needs.  MAKE SURE YOU:  Understand these instructions.  Get help right away if you are not doing well or get worse.    DENTAL ANTIBIOTICS:  In most cases prophylactic antibiotics for Dental procdeures after total joint surgery are not necessary.  Exceptions are as follows:  1. History of prior total joint infection  2. Severely immunocompromised (Organ Transplant, cancer chemotherapy, Rheumatoid biologic meds such as Humera)  3. Poorly controlled diabetes (A1C &gt; 8.0, blood glucose over 200)  If you have one of these conditions, contact your surgeon for an antibiotic prescription, prior to your dental procedure.    Pick up stool softner and laxative for home use following surgery while on pain medications. Do not submerge incision under water. Please use good hand washing techniques while changing dressing each day. May shower starting three days after surgery. Please use a clean towel to pat the incision dry following showers. Continue to use ice for pain and swelling after surgery. Do not use any lotions or creams on the incision until instructed by your surgeon.  Information on my medicine - XARELTO (Rivaroxaban)  This medication education was reviewed with me or my healthcare representative as part of my discharge preparation.    Why was Xarelto prescribed for you? Xarelto was prescribed for you to reduce the risk of blood clots forming after orthopedic  surgery. The medical term for these abnormal blood clots is venous thromboembolism (VTE).  What do you need to know about xarelto ? Take your Xarelto ONCE DAILY at the same time every day. You may take it either with or without food.  If you have difficulty swallowing the tablet whole, you may crush it and mix in applesauce just prior to taking your dose.  Take Xarelto exactly as prescribed by your doctor and DO NOT stop taking Xarelto without talking to the doctor who prescribed the medication.  Stopping without other VTE prevention medication to take the place of Xarelto may increase your risk of developing a clot.  After discharge, you should have regular check-up appointments with your healthcare provider that is prescribing your Xarelto.    What do you do if you miss a dose? If you miss a dose, take it as soon as you remember on the same day then continue your regularly scheduled once daily regimen the next day. Do not take two doses of Xarelto on the same day.   Important Safety Information A possible side effect of Xarelto is bleeding. You should call your healthcare provider right away if you experience any of the following: Bleeding from an injury or your nose that does not stop. Unusual colored urine (red or dark brown) or unusual colored stools (red or black). Unusual bruising for unknown reasons. A serious fall or if you hit your head (even if there is no bleeding).  Some medicines may interact with Xarelto and might increase your risk of bleeding while on Xarelto. To help avoid this, consult your healthcare provider or pharmacist prior to using any new prescription or non-prescription medications, including herbals, vitamins, non-steroidal anti-inflammatory drugs (NSAIDs) and supplements.  This website has more information on Xarelto: VisitDestination.com.br.

## 2023-08-05 NOTE — Anesthesia Procedure Notes (Addendum)
Spinal  Patient location during procedure: pre-op Start time: 08/05/2023 11:45 AM End time: 08/05/2023 11:49 AM Reason for block: post-op pain management Staffing Performed: anesthesiologist  Anesthesiologist: Shelton Silvas, MD Performed by: Shelton Silvas, MD Authorized by: Shelton Silvas, MD   Preanesthetic Checklist Completed: patient identified, IV checked, site marked, risks and benefits discussed, surgical consent, monitors and equipment checked, pre-op evaluation and timeout performed Spinal Block Patient position: sitting Prep: DuraPrep and site prepped and draped Location: L3-4 Injection technique: single-shot Needle Needle type: Pencan  Needle gauge: 24 G Needle length: 10 cm Needle insertion depth: 10 cm Additional Notes Patient tolerated well. No immediate complications.  Functioning IV was confirmed and monitors were applied. Sterile prep and drape, including hand hygiene and sterile gloves were used. The patient was positioned and the back was prepped. The skin was anesthetized with lidocaine. Free flow of clear CSF was obtained prior to injecting local anesthetic into the CSF. The spinal needle aspirated freely following injection. The needle was carefully withdrawn. The patient tolerated the procedure well.

## 2023-08-05 NOTE — Plan of Care (Signed)

## 2023-08-05 NOTE — Progress Notes (Signed)
   08/05/23 2216  BiPAP/CPAP/SIPAP  BiPAP/CPAP/SIPAP Pt Type Adult  BiPAP/CPAP/SIPAP Resmed (Home Unit/Hose/Mask)  FiO2 (%) 21 %  Flow Rate 0 lpm  Patient Home Equipment Yes  CPAP/SIPAP surface wiped down Yes  Safety Check Completed by RT for Home Unit Yes, no issues noted   Set up pts. Home CPAP-currently on R/A, made aware to notify if needed.

## 2023-08-05 NOTE — Anesthesia Preprocedure Evaluation (Addendum)
Anesthesia Evaluation  Patient identified by MRN, date of birth, ID band Patient awake    Reviewed: Allergy & Precautions, NPO status , Patient's Chart, lab work & pertinent test results  History of Anesthesia Complications (+) PONV and history of anesthetic complications  Airway Mallampati: I  TM Distance: >3 FB Neck ROM: Full    Dental  (+) Teeth Intact, Dental Advisory Given   Pulmonary asthma , sleep apnea and Continuous Positive Airway Pressure Ventilation    breath sounds clear to auscultation       Cardiovascular negative cardio ROS  Rhythm:Regular Rate:Normal     Neuro/Psych  Headaches    GI/Hepatic Neg liver ROS,GERD  Medicated,,  Endo/Other  negative endocrine ROS    Renal/GU negative Renal ROS     Musculoskeletal  (+) Arthritis ,    Abdominal   Peds  Hematology  (+) Blood dyscrasia, anemia   Anesthesia Other Findings   Reproductive/Obstetrics                             Anesthesia Physical Anesthesia Plan  ASA: 2  Anesthesia Plan: Spinal   Post-op Pain Management: Tylenol PO (pre-op)*   Induction: Intravenous  PONV Risk Score and Plan: 4 or greater and Ondansetron, Dexamethasone, Midazolam and Scopolamine patch - Pre-op  Airway Management Planned: Oral ETT  Additional Equipment: None  Intra-op Plan:   Post-operative Plan: Extubation in OR  Informed Consent: I have reviewed the patients History and Physical, chart, labs and discussed the procedure including the risks, benefits and alternatives for the proposed anesthesia with the patient or authorized representative who has indicated his/her understanding and acceptance.     Dental advisory given  Plan Discussed with: CRNA  Anesthesia Plan Comments: (Lab Results      Component                Value               Date                      WBC                      9.1                 07/23/2023                 HGB                      13.9                07/23/2023                HCT                      43.6                07/23/2023                MCV                      91.8                07/23/2023                PLT  376                 07/23/2023           )       Anesthesia Quick Evaluation

## 2023-08-05 NOTE — Interval H&P Note (Signed)
History and Physical Interval Note:  08/05/2023 9:52 AM  Emily Brock  has presented today for surgery, with the diagnosis of Left hip osteoarthritis.  The various methods of treatment have been discussed with the patient and family. After consideration of risks, benefits and other options for treatment, the patient has consented to  Procedure(s): TOTAL HIP ARTHROPLASTY ANTERIOR APPROACH (Left) as a surgical intervention.  The patient's history has been reviewed, patient examined, no change in status, stable for surgery.  I have reviewed the patient's chart and labs.  Questions were answered to the patient's satisfaction.     Homero Fellers [name]

## 2023-08-06 ENCOUNTER — Encounter (HOSPITAL_COMMUNITY): Payer: Self-pay | Admitting: Orthopedic Surgery

## 2023-08-06 DIAGNOSIS — M1612 Unilateral primary osteoarthritis, left hip: Secondary | ICD-10-CM | POA: Diagnosis not present

## 2023-08-06 LAB — CBC
HCT: 28.8 % — ABNORMAL LOW (ref 36.0–46.0)
HCT: 30.1 % — ABNORMAL LOW (ref 36.0–46.0)
Hemoglobin: 9.2 g/dL — ABNORMAL LOW (ref 12.0–15.0)
Hemoglobin: 9.6 g/dL — ABNORMAL LOW (ref 12.0–15.0)
MCH: 29.3 pg (ref 26.0–34.0)
MCH: 29.4 pg (ref 26.0–34.0)
MCHC: 31.9 g/dL (ref 30.0–36.0)
MCHC: 31.9 g/dL (ref 30.0–36.0)
MCV: 91.7 fL (ref 80.0–100.0)
MCV: 92 fL (ref 80.0–100.0)
Platelets: 263 10*3/uL (ref 150–400)
Platelets: 255 10*3/uL (ref 150–400)
RBC: 3.14 MIL/uL — ABNORMAL LOW (ref 3.87–5.11)
RBC: 3.27 MIL/uL — ABNORMAL LOW (ref 3.87–5.11)
RDW: 13 % (ref 11.5–15.5)
RDW: 12.8 % (ref 11.5–15.5)
WBC: 14.6 10*3/uL — ABNORMAL HIGH (ref 4.0–10.5)
WBC: 16.5 10*3/uL — ABNORMAL HIGH (ref 4.0–10.5)
nRBC: 0 % (ref 0.0–0.2)
nRBC: 0 % (ref 0.0–0.2)

## 2023-08-06 LAB — BASIC METABOLIC PANEL
Anion gap: 9 (ref 5–15)
BUN: 13 mg/dL (ref 8–23)
CO2: 23 mmol/L (ref 22–32)
Calcium: 8.2 mg/dL — ABNORMAL LOW (ref 8.9–10.3)
Chloride: 100 mmol/L (ref 98–111)
Creatinine, Ser: 0.64 mg/dL (ref 0.44–1.00)
GFR, Estimated: >60 mL/min (ref >60)
Glucose, Bld: 125 mg/dL — ABNORMAL HIGH (ref 70–99)
Potassium: 3.7 mmol/L (ref 3.5–5.1)
Sodium: 132 mmol/L — ABNORMAL LOW (ref 135–145)

## 2023-08-06 MED ORDER — MORPHINE SULFATE (PF) 2 MG/ML IV SOLN: 1.0000 mg | INTRAVENOUS | Status: DC | PRN

## 2023-08-06 MED ORDER — SODIUM CHLORIDE 0.9 % IV BOLUS
250.0000 mL | Freq: Once | INTRAVENOUS | Status: AC
  Administered 2023-08-06: 250 mL via INTRAVENOUS

## 2023-08-06 MED ORDER — METHOCARBAMOL 500 MG PO TABS: 500.0000 mg | ORAL_TABLET | Freq: Four times a day (QID) | ORAL | 0 refills | Status: AC | PRN

## 2023-08-06 MED ORDER — RIVAROXABAN 10 MG PO TABS: 10.0000 mg | ORAL_TABLET | Freq: Every day | ORAL | 0 refills | Status: AC

## 2023-08-06 MED ORDER — TRAMADOL HCL 50 MG PO TABS: 50.0000 mg | ORAL_TABLET | Freq: Four times a day (QID) | ORAL | 0 refills | Status: AC

## 2023-08-06 NOTE — Progress Notes (Signed)
Physical Therapy Treatment Patient Details Name: Emily Brock MRN: [id number] DOB: 1956/10/21 Today's Date: 08/06/2023   History of Present Illness 67 yo female presents to therapy s/p L THA, anterior approach on 08/05/2023 due to failure of conservative measures. Pt PMH includes but is not limited to: spondylolisthesis at L4-L5, s/p back sx/PILF, OSA on CPAP, anemia, anxiety, arthritis, asthma, and thoracic scapular infusion.    PT Comments  POD # 1 pm session Assisted with amb in hallway, practiced the stairs, assisted to bathroom then back to bed. Pt HAS met her mobility goals to D/C to home today which pt prefers.  "I will get better rest at home", stated pt.  Addressed all mobility questions, discussed appropriate activity, issued HEP handout and educated on use of ICE.  Pt ready for D/C to home.    If plan is discharge home, recommend the following: A little help with walking and/or transfers;A little help with bathing/dressing/bathroom;Assistance with cooking/housework;Assist for transportation;Help with stairs or ramp for entrance   Can travel by private vehicle        Equipment Recommendations  None recommended by PT    Recommendations for Other Services       Precautions / Restrictions Precautions Precautions: Fall Restrictions Weight Bearing Restrictions: No LLE Weight Bearing: Weight bearing as tolerated     Mobility  Bed Mobility Overal bed mobility: Needs Assistance Bed Mobility: Sit to Supine     Supine to sit: Supervision, Contact guard     General bed mobility comments: demonstarted and instructed how to use a belt to self assist LE back onto bed with increased time.    Transfers Overall transfer level: Needs assistance Equipment used: Rolling walker (2 wheels) Transfers: Sit to/from Stand Sit to Stand: Contact guard assist, From elevated surface           General transfer comment: increased time with VC's on safety with turns.  Also assisted  with a toilet transfer.    Ambulation/Gait Ambulation/Gait assistance: Supervision, Contact guard assist Gait Distance (Feet): 28 Feet Assistive device: Rolling walker (2 wheels) Gait Pattern/deviations: Step-to pattern Gait velocity: decreased     General Gait Details: tolerated a functional distance of 28 feet with increased time post mild c/o nausea and feeling "warm".   Stairs Stairs: Yes Stairs assistance: Min assist Stair Management: No rails, Step to pattern, Forwards, With walker Number of Stairs: 2 General stair comments: VC's on proper tech up forward with walker and second assist securing walker.   Wheelchair Mobility     Tilt Bed    Modified Rankin (Stroke Patients Only)       Balance                                            Cognition Arousal: Alert Behavior During Therapy: WFL for tasks assessed/performed Overall Cognitive Status: Within Functional Limits for tasks assessed                                 General Comments: AxO x 3 very pleasant Lady who is eager to go home "today" stated pt.        Exercises      General Comments        Pertinent Vitals/Pain Pain Assessment Pain Assessment: No/denies pain Pain Score: 8  Pain Location: L hip  Pain Descriptors / Indicators: Aching, Constant, Discomfort, Operative site guarding Pain Intervention(s): Monitored during session, Premedicated before session, Repositioned, Ice applied    Home Living                          Prior Function            PT Goals (current goals can now be found in the care plan section) Progress towards PT goals: Progressing toward goals    Frequency    7X/week      PT Plan      Co-evaluation              AM-PAC PT "6 Clicks" Mobility   Outcome Measure  Help needed turning from your back to your side while in a flat bed without using bedrails?: A Little Help needed moving from lying on your back to  sitting on the side of a flat bed without using bedrails?: A Little Help needed moving to and from a bed to a chair (including a wheelchair)?: A Little Help needed standing up from a chair using your arms (e.g., wheelchair or bedside chair)?: A Little Help needed to walk in hospital room?: A Little Help needed climbing 3-5 steps with a railing? : A Lot 6 Click Score: 17    End of Session Equipment Utilized During Treatment: Gait belt Activity Tolerance: Patient tolerated treatment well Patient left: in bed;with bed alarm set Nurse Communication: Mobility status;Patient requests pain meds PT Visit Diagnosis: Unsteadiness on feet (R26.81);Other abnormalities of gait and mobility (R26.89);Muscle weakness (generalized) (M62.81);Pain Pain - Right/Left: Left Pain - part of body: Hip;Leg     Time: 1410-1435 PT Time Calculation (min) (ACUTE ONLY): 25 min  Charges:    $Gait Training: 8-22 mins $Therapeutic Activity: 8-22 mins PT General Charges $$ ACUTE PT VISIT: 1 Visit                    Felecia Shelling  PTA Acute  Rehabilitation Services Office M-F          [phone]

## 2023-08-06 NOTE — Progress Notes (Signed)
Physical Therapy Treatment Patient Details Name: Emily Brock MRN: [id number] DOB: 1956/06/02 Today's Date: 08/06/2023   History of Present Illness 67 yo female presents to therapy s/p L THA, anterior approach on 08/05/2023 due to failure of conservative measures. Pt PMH includes but is not limited to: spondylolisthesis at L4-L5, s/p back sx/PILF, OSA on CPAP, anemia, anxiety, arthritis, asthma, and thoracic scapular infusion.    PT Comments  POD # 1 am session Pt stated she was nauseous this morning.  Assisted OOB.  General bed mobility comments: demonstarted and instructed how to use a belt to self assist LE off bed with increased time.  General Gait Details: tolerated a functional distance of 45 feet with increased time post mild c/o nausea and feeling "warm". Returned to room in recliner.  Performed a few TE's followed by ICE. Will see pt again this afternoon for stair training and complete HEP education. Pt wants to D/C to home later today.   If plan is discharge home, recommend the following: A little help with walking and/or transfers;A little help with bathing/dressing/bathroom;Assistance with cooking/housework;Assist for transportation;Help with stairs or ramp for entrance   Can travel by private vehicle        Equipment Recommendations  None recommended by PT    Recommendations for Other Services       Precautions / Restrictions Precautions Precautions: Fall Restrictions Weight Bearing Restrictions: No LLE Weight Bearing: Weight bearing as tolerated     Mobility  Bed Mobility Overal bed mobility: Needs Assistance Bed Mobility: Supine to Sit     Supine to sit: Supervision, Contact guard     General bed mobility comments: demonstarted and instructed how to use a belt to self assist LE off bed with increased time.    Transfers Overall transfer level: Needs assistance Equipment used: Rolling walker (2 wheels) Transfers: Sit to/from Stand Sit to Stand: Contact  guard assist, From elevated surface           General transfer comment: increased time with VC's on safety with turns    Ambulation/Gait Ambulation/Gait assistance: Supervision, Contact guard assist Gait Distance (Feet): 45 Feet   Gait Pattern/deviations: Step-to pattern Gait velocity: decreased     General Gait Details: tolerated a functional distance of 45 feet with increased time post mild c/o nausea and feeling "warm".   Stairs             Wheelchair Mobility     Tilt Bed    Modified Rankin (Stroke Patients Only)       Balance                                            Cognition Arousal: Alert Behavior During Therapy: WFL for tasks assessed/performed Overall Cognitive Status: Within Functional Limits for tasks assessed                                 General Comments: AxO x 3 very pleasant Lady        Exercises      General Comments        Pertinent Vitals/Pain Pain Assessment Pain Assessment: 0-10 Pain Score: 8  Pain Location: L hip Pain Descriptors / Indicators: Aching, Constant, Discomfort, Operative site guarding Pain Intervention(s): Monitored during session, Premedicated before session, Repositioned, Ice applied    Home  Living                          Prior Function            PT Goals (current goals can now be found in the care plan section) Progress towards PT goals: Progressing toward goals    Frequency    7X/week      PT Plan      Co-evaluation              AM-PAC PT "6 Clicks" Mobility   Outcome Measure  Help needed turning from your back to your side while in a flat bed without using bedrails?: A Little Help needed moving from lying on your back to sitting on the side of a flat bed without using bedrails?: A Little Help needed moving to and from a bed to a chair (including a wheelchair)?: A Little Help needed standing up from a chair using your arms (e.g.,  wheelchair or bedside chair)?: A Little Help needed to walk in hospital room?: A Little Help needed climbing 3-5 steps with a railing? : A Lot 6 Click Score: 17    End of Session Equipment Utilized During Treatment: Gait belt Activity Tolerance: Patient tolerated treatment well Patient left: in chair;with call bell/phone within reach;with nursing/sitter in room Nurse Communication: Mobility status;Patient requests pain meds PT Visit Diagnosis: Unsteadiness on feet (R26.81);Other abnormalities of gait and mobility (R26.89);Muscle weakness (generalized) (M62.81);Pain Pain - Right/Left: Left Pain - part of body: Hip;Leg     Time: 0865-7846 PT Time Calculation (min) (ACUTE ONLY): 25 min  Charges:    $Gait Training: 8-22 mins $Therapeutic Activity: 8-22 mins PT General Charges $$ ACUTE PT VISIT: 1 Visit                     Felecia Shelling  PTA Acute  Rehabilitation Services Office M-F          [phone]

## 2023-08-06 NOTE — TOC Transition Note (Signed)
Transition of Care [facility]) - CM/SW Discharge Note  Patient Details  Name: Emily Brock MRN: [id number] Date of Birth: 1956-09-06  [facility] CM/SW Contact:  Ewing Schlein, LCSW Phone Number: 08/06/2023, 9:56 AM  Clinical Narrative: Patient is expected to discharge home after working with PT. CSW met with patient to confirm discharge plan. Patient will go home with a home exercise program (HEP). Patient has a rolling walker and BSC at home, so there are no DME needs at this time. TOC signing off.    Final next level of care: Home/Self Care Barriers to Discharge: No Barriers Identified  Patient Goals and CMS Choice Choice offered to / list presented to : NA  Discharge Plan and Services Additional resources added to the After Visit Summary for       DME Arranged: N/A DME Agency: NA  Social Determinants of Health (SDOH) Interventions SDOH Screenings   Food Insecurity: No Food Insecurity (08/05/2023)  Housing: Low Risk  (08/05/2023)  Transportation Needs: No Transportation Needs (08/05/2023)  Utilities: Not At Risk (08/05/2023)  Tobacco Use: Unknown (08/05/2023)   Readmission Risk Interventions     No data to display

## 2023-08-06 NOTE — Care Management Obs Status (Signed)
MEDICARE OBSERVATION STATUS NOTIFICATION   Patient Details  Name: Emily Brock MRN: [id number] Date of Birth: [birth year]   Medicare Observation Status Notification Given:  Yes    Ewing Schlein, LCSW 08/06/2023, 1:09 PM

## 2023-08-06 NOTE — Progress Notes (Signed)
   Subjective: 1 Day Post-Op Procedure(s) (LRB): TOTAL HIP ARTHROPLASTY ANTERIOR APPROACH (Left) Patient reports pain as mild.   Patient seen in rounds by Dr. Lequita Halt. Pt complains of nausea and vomiting this morning. She denies chest pain or SOB. Hgb 9.2 this morning and hypotensive with BP 95/57.  Foley catheter removed this AM. We will continue therapy today, ambulated 40' yesterday.   Objective: Vital signs in last 24 hours: Temp:  [97.5 F (36.4 C)-98.9 F (37.2 C)] 98.6 F (37 C) (08/20 0544) Pulse Rate:  [73-114] 110 (08/20 0544) Resp:  [10-18] 16 (08/20 0544) BP: (94-139)/(56-87) 95/57 (08/20 0544) SpO2:  [95 %-100 %] 97 % (08/20 0544) FiO2 (%):  [21 %] 21 % (08/19 2216) Weight:  [55.3 kg] 55.3 kg (08/19 0933)  Intake/Output from previous day:  Intake/Output Summary (Last 24 hours) at 08/06/2023 0716 Last data filed at 08/06/2023 0200 Gross per 24 hour  Intake 2903.49 ml  Output 2050 ml  Net 853.49 ml     Intake/Output this shift: No intake/output data recorded.  Labs: Recent Labs    08/06/23 0339  HGB 9.2*   Recent Labs    08/06/23 0339  WBC 14.6*  RBC 3.14*  HCT 28.8*  PLT 263   Recent Labs    08/06/23 0339  NA 132*  K 3.7  CL 100  CO2 23  BUN 13  CREATININE 0.64  GLUCOSE 125*  CALCIUM 8.2*   No results for input(s): "LABPT", "INR" in the last 72 hours.  Exam: General - Patient is Alert, Appropriate, and Oriented Extremity - Neurovascular intact Dorsiflexion/Plantar flexion intact Dressing - dressing C/D/I Motor Function - intact, moving foot and toes well on exam.   Past Medical History:  Diagnosis Date   Anemia    Anxiety    Arthritis    Asthma    Headache    PONV (postoperative nausea and vomiting)    Recurrent sinus infections    Sleep apnea    uses CPAP    Assessment/Plan: 1 Day Post-Op Procedure(s) (LRB): TOTAL HIP ARTHROPLASTY ANTERIOR APPROACH (Left) Principal Problem:   OA (osteoarthritis) of hip Active  Problems:   Primary osteoarthritis of left hip  Estimated body mass index is 21.61 kg/m as calculated from the following:   Height as of this encounter: 5\' 3"  (1.6 m).   Weight as of this encounter: 55.3 kg. Up with therapy  Fluid bolus ordered this morning. D/c IV hydromorphone to hopefully help with N/V. Will order IV morphine for breakthrough pain instead. DVT Prophylaxis - Xarelto Weight bearing as tolerated. Continue therapy.  Plan is to go Home after hospital stay. Plan for discharge once she progresses with therapy and meeting goals. Follow-up in the office in 2 weeks.  The PDMP database was reviewed today prior to any opioid medications being prescribed to this patient.  Weston Brass, PA-C Orthopedic Surgery 08/06/2023, 7:16 AM

## 2023-08-06 NOTE — Progress Notes (Signed)
RN noted OTC container of polyethelene glycol on patient's overbed table. Patient states she took a dose of her own this am because she's very regular at home and wasn't sure that staff would get the timing right on administering it here. Notified patient that medication is available and ordered and instructed not to take any of her own medications without notifying and asking her nurses. Patient verbalized understanding and stated she does not have any other medications with her, OTC or otherwise and that she took the last dose of polyethelene glycol from the bottle she has.

## 2023-08-08 NOTE — Discharge Summary (Signed)
Physician Discharge Summary   Patient ID: Emily Brock MRN: [id number] DOB/AGE: October 08, 1956 67 y.o.  Admit date: 08/05/2023 Discharge date: 08/06/2023  Primary Diagnosis: Left hip osteoarthritis   Admission Diagnoses:  Past Medical History:  Diagnosis Date   Anemia    Anxiety    Arthritis    Asthma    Headache    PONV (postoperative nausea and vomiting)    Recurrent sinus infections    Sleep apnea    uses CPAP   Discharge Diagnoses:   Principal Problem:   OA (osteoarthritis) of hip Active Problems:   Primary osteoarthritis of left hip  Estimated body mass index is 21.61 kg/m as calculated from the following:   Height as of this encounter: 5\' 3"  (1.6 m).   Weight as of this encounter: 55.3 kg.  Procedure:  Procedure(s) (LRB): TOTAL HIP ARTHROPLASTY ANTERIOR APPROACH (Left)   Consults: None  HPI: Emily Brock is a 67 y.o. female who has advanced end-  stage arthritis of their Left  hip with progressively worsening pain and  dysfunction.The patient has failed nonoperative management and presents for total hip arthroplasty.   Laboratory Data: Admission on 08/05/2023, Discharged on 08/06/2023  Component Date Value Ref Range Status   ABO/RH(D) 08/05/2023 O POS   Final   Antibody Screen 08/05/2023 POS   Final   Sample Expiration 08/05/2023 08/08/2023,2359   Final   Unit Number 08/05/2023 [id number]   Final   Blood Component Type 08/05/2023 RED CELLS,LR   Final   Unit division 08/05/2023 00   Final   Status of Unit 08/05/2023 ALLOCATED   Final   Transfusion Status 08/05/2023 OK TO TRANSFUSE   Final   Crossmatch Result 08/05/2023 COMPATIBLE   Final   Unit Number 08/05/2023 [id number]   Final   Blood Component Type 08/05/2023 RBC LR PHER2   Final   Unit division 08/05/2023 00   Final   Status of Unit 08/05/2023 ALLOCATED   Final   Transfusion Status 08/05/2023 OK TO TRANSFUSE   Final   Crossmatch Result 08/05/2023 COMPATIBLE   Final   Blood Product Unit Number  08/05/2023 [id number]   Final   PRODUCT CODE 08/05/2023 [id number]   Final   Unit Type and Rh 08/05/2023 5100   Final   Blood Product Expiration Date 08/05/2023 [id number]   Final   Blood Product Unit Number 08/05/2023 [id number]   Final   PRODUCT CODE 08/05/2023 [id number]   Final   Unit Type and Rh 08/05/2023 5100   Final   Blood Product Expiration Date 08/05/2023 [id number]   Final   WBC 08/06/2023 14.6 (H)  4.0 - 10.5 K/uL Final   RBC 08/06/2023 3.14 (L)  3.87 - 5.11 MIL/uL Final   Hemoglobin 08/06/2023 9.2 (L)  12.0 - 15.0 g/dL Final   HCT 60/09/9322 28.8 (L)  36.0 - 46.0 % Final   MCV 08/06/2023 91.7  80.0 - 100.0 fL Final   MCH 08/06/2023 29.3  26.0 - 34.0 pg Final   MCHC 08/06/2023 31.9  30.0 - 36.0 g/dL Final   RDW 55/73/2202 13.0  11.5 - 15.5 % Final   Platelets 08/06/2023 263  150 - 400 K/uL Final   nRBC 08/06/2023 0.0  0.0 - 0.2 % Final   Performed at [facility], [address], [place], [place] [address]   Sodium 08/06/2023 132 (L)  135 - 145 mmol/L Final   Potassium 08/06/2023 3.7  3.5 - 5.1 mmol/L Final   Chloride 08/06/2023 100  98 - 111 mmol/L Final   CO2 08/06/2023 23  22 - 32 mmol/L Final   Glucose, Bld 08/06/2023 125 (H)  70 - 99 mg/dL Final   Glucose reference range applies only to samples taken after fasting for at least 8 hours.   BUN 08/06/2023 13  8 - 23 mg/dL Final   Creatinine, Ser 08/06/2023 0.64  0.44 - 1.00 mg/dL Final   Calcium 21/30/8657 8.2 (L)  8.9 - 10.3 mg/dL Final   GFR, Estimated 08/06/2023 >60  >60 mL/min Final   Comment: (NOTE) Calculated using the CKD-EPI Creatinine Equation (2021)    Anion gap 08/06/2023 9  5 - 15 Final   Performed at [facility], [address] [place], [place] [address]   WBC 08/06/2023 16.5 (H)  4.0 - 10.5 K/uL Final   RBC 08/06/2023 3.27 (L)  3.87 - 5.11 MIL/uL Final   Hemoglobin 08/06/2023 9.6 (L)  12.0 - 15.0 g/dL Final   HCT 29/52/8413 30.1 (L)  36.0 - 46.0 % Final    MCV 08/06/2023 92.0  80.0 - 100.0 fL Final   MCH 08/06/2023 29.4  26.0 - 34.0 pg Final   MCHC 08/06/2023 31.9  30.0 - 36.0 g/dL Final   RDW 24/40/1027 12.8  11.5 - 15.5 % Final   Platelets 08/06/2023 255  150 - 400 K/uL Final   nRBC 08/06/2023 0.0  0.0 - 0.2 % Final   Performed at [facility] [address], [place], [place] [address]  Hospital Outpatient Visit on 07/23/2023  Component Date Value Ref Range Status   MRSA, PCR 07/23/2023 NEGATIVE  NEGATIVE Final   Staphylococcus aureus 07/23/2023 NEGATIVE  NEGATIVE Final   Comment: (NOTE) The Xpert SA Assay (FDA approved for NASAL specimens in patients 74 years of age and older), is one component of a comprehensive surveillance program. It is not intended to diagnose infection nor to guide or monitor treatment. Performed at [facility], [address], [place], [place] [address]    Sodium 07/23/2023 138  135 - 145 mmol/L Final   Potassium 07/23/2023 4.3  3.5 - 5.1 mmol/L Final   Chloride 07/23/2023 101  98 - 111 mmol/L Final   CO2 07/23/2023 27  22 - 32 mmol/L Final   Glucose, Bld 07/23/2023 96  70 - 99 mg/dL Final   Glucose reference range applies only to samples taken after fasting for at least 8 hours.   BUN 07/23/2023 10  8 - 23 mg/dL Final   Creatinine, Ser 07/23/2023 0.63  0.44 - 1.00 mg/dL Final   Calcium 74/25/9563 10.0  8.9 - 10.3 mg/dL Final   Total Protein 87/56/4332 7.9  6.5 - 8.1 g/dL Final   Albumin 95/18/8416 4.6  3.5 - 5.0 g/dL Final   AST 60/63/0160 19  15 - 41 U/L Final   ALT 07/23/2023 16  0 - 44 U/L Final   Alkaline Phosphatase 07/23/2023 54  38 - 126 U/L Final   Total Bilirubin 07/23/2023 0.8  0.3 - 1.2 mg/dL Final   GFR, Estimated 07/23/2023 >60  >60 mL/min Final   Comment: (NOTE) Calculated using the CKD-EPI Creatinine Equation (2021)    Anion gap 07/23/2023 10  5 - 15 Final   Performed at [facility], [address], [place], [place] [address]    WBC 07/23/2023 9.1  4.0 - 10.5 K/uL Final   RBC 07/23/2023 4.75  3.87 - 5.11 MIL/uL Final   Hemoglobin 07/23/2023 13.9  12.0 -  15.0 g/dL Final   HCT 16/09/9603 43.6  36.0 - 46.0 % Final   MCV 07/23/2023 91.8  80.0 - 100.0 fL Final   MCH 07/23/2023 29.3  26.0 - 34.0 pg Final   MCHC 07/23/2023 31.9  30.0 - 36.0 g/dL Final   RDW 54/08/8118 12.8  11.5 - 15.5 % Final   Platelets 07/23/2023 376  150 - 400 K/uL Final   nRBC 07/23/2023 0.0  0.0 - 0.2 % Final   Performed at [facility], [address], [place], [place] [address]   ABO/RH(D) 07/23/2023 O POS   Final   Antibody Screen 07/23/2023 POS   Final   Sample Expiration 07/23/2023 08/04/2023,2359   Final   Antibody Identification 07/23/2023    Final                   Value:ANTI LUA Anselmo Rod a) Performed at [facility], [address] [place], [place] [address]    Unit Number 07/23/2023 [id number]   Final   Blood Component Type 07/23/2023 RBC LR PHER2   Final   Unit division 07/23/2023 00   Final   Status of Unit 07/23/2023 REL FROM [facility]   Final   Transfusion Status 07/23/2023 OK TO TRANSFUSE   Final   Crossmatch Result 07/23/2023 COMPATIBLE   Final   Unit Number 07/23/2023 [id number]   Final   Blood Component Type 07/23/2023 RED CELLS,LR   Final   Unit division 07/23/2023 00   Final   Status of Unit 07/23/2023 REL FROM [facility]   Final   Transfusion Status 07/23/2023 OK TO TRANSFUSE   Final   Crossmatch Result 07/23/2023 COMPATIBLE   Final   Blood Product Unit Number 07/23/2023 [id number]   Final   PRODUCT CODE 07/23/2023 [id number]   Final   Unit Type and Rh 07/23/2023 5100   Final   Blood Product Expiration Date 07/23/2023 [id number]   Final   Blood Product Unit Number 07/23/2023 [id number]   Final   PRODUCT CODE 07/23/2023 [id number]   Final   Unit Type and Rh 07/23/2023 5100   Final   Blood Product Expiration Date 07/23/2023 [id number]   Final     X-Rays:DG Pelvis  Portable  Result Date: 08/05/2023 CLINICAL DATA:  Postop left hip replacement. EXAM: PORTABLE PELVIS 1-2 VIEWS COMPARISON:  None Available. FINDINGS: Left hip arthroplasty in expected alignment. No periprosthetic lucency or fracture. Recent postsurgical change includes air and edema in the soft tissues. IMPRESSION: Left hip arthroplasty without immediate postoperative complication. Electronically Signed   By: Narda Rutherford M.D.   On: 08/05/2023 15:09   DG HIP UNILAT WITH PELVIS 1V LEFT  Result Date: 08/05/2023 CLINICAL DATA:  Elective surgery. EXAM: DG HIP (WITH OR WITHOUT PELVIS) 1V*L* COMPARISON:  None Available. FINDINGS: Two fluoroscopic spot views of the pelvis and left hip obtained in the operating room. Images during hip arthroplasty. Fluoroscopy time 7 seconds. Dose 0.645 mGy. IMPRESSION: Intraoperative fluoroscopy for left hip arthroplasty. Electronically Signed   By: Narda Rutherford M.D.   On: 08/05/2023 15:09   DG C-Arm 1-60 Min-No Report  Result Date: 08/05/2023 Fluoroscopy was utilized by the requesting physician.  No radiographic interpretation.   DG C-Arm 1-60 Min-No Report  Result Date: 08/05/2023 Fluoroscopy was utilized by the requesting physician.  No radiographic interpretation.    EKG:No orders found for this or any previous visit.   Hospital Course: Emily Brock is a 67 y.o. who was admitted to [facility]. They  were brought to the operating room on 08/05/2023 and underwent Procedure(s): TOTAL HIP ARTHROPLASTY ANTERIOR APPROACH.  Patient tolerated the procedure well and was later transferred to the recovery room and then to the orthopaedic floor for postoperative care. They were given PO and IV analgesics for pain control following their surgery. They were given 24 hours of postoperative antibiotics of  Anti-infectives (From admission, onward)    Start     Dose/Rate Route Frequency Ordered Stop   08/05/23 1500  ceFAZolin (ANCEF) IVPB 2g/100 mL premix         2 g 200 mL/hr over 30 Minutes Intravenous Every 6 hours 08/05/23 1450 08/05/23 2246   08/05/23 0930  ceFAZolin (ANCEF) IVPB 2g/100 mL premix        2 g 200 mL/hr over 30 Minutes Intravenous On call to O.R. 08/05/23 1610 08/05/23 1208      and started on DVT prophylaxis in the form of Xarelto.   PT and OT were ordered for total joint protocol. Discharge planning consulted to help with postop disposition and equipment needs.  Patient had an uneventful night on the evening of surgery. They started to get up OOB with therapy on POD 0. On POD 1 Hgb noted to be 9.2 and slightly hypotensive. BP was responsive to fluid bolus and patient remained asymptomatic. Hgb rechecked in the afternoon and noted to be 9.6. Pt was seen during rounds and was ready to go home pending progress with therapy. She worked with therapy on POD #1 and was meeting her goals. Pt was discharged to home later that day in stable condition.  Diet: Regular diet Activity: WBAT Follow-up: in 2 weeks Disposition: Home Discharged Condition: good   Discharge Instructions     Call MD / Call 911   Complete by: As directed    If you experience chest pain or shortness of breath, CALL 911 and be transported to the hospital emergency room.  If you develope a fever above 101 F, pus (white drainage) or increased drainage or redness at the wound, or calf pain, call your surgeon's office.   Change dressing   Complete by: As directed    You have an adhesive waterproof bandage over the incision. Leave this in place until your first follow-up appointment. Once you remove this you will not need to place another bandage.   Constipation Prevention   Complete by: As directed    Drink plenty of fluids.  Prune juice may be helpful.  You may use a stool softener, such as Colace (over the counter) 100 mg twice a day.  Use MiraLax (over the counter) for constipation as needed.   Diet - low sodium heart healthy   Complete by: As directed    Do not sit  on low chairs, stoools or toilet seats, as it may be difficult to get up from low surfaces   Complete by: As directed    Driving restrictions   Complete by: As directed    No driving for two weeks   Post-operative opioid taper instructions:   Complete by: As directed    POST-OPERATIVE OPIOID TAPER INSTRUCTIONS: It is important to wean off of your opioid medication as soon as possible. If you do not need pain medication after your surgery it is ok to stop day one. Opioids include: Codeine, Hydrocodone(Norco, Vicodin), Oxycodone(Percocet, oxycontin) and hydromorphone amongst others.  Long term and even short term use of opiods can cause: Increased pain response Dependence Constipation Depression Respiratory depression And more.  Withdrawal symptoms can include Flu like symptoms Nausea, vomiting And more Techniques to manage these symptoms Hydrate well Eat regular healthy meals Stay active Use relaxation techniques(deep breathing, meditating, yoga) Do Not substitute Alcohol to help with tapering If you have been on opioids for less than two weeks and do not have pain than it is ok to stop all together.  Plan to wean off of opioids This plan should start within one week post op of your joint replacement. Maintain the same interval or time between taking each dose and first decrease the dose.  Cut the total daily intake of opioids by one tablet each day Next start to increase the time between doses. The last dose that should be eliminated is the evening dose.      TED hose   Complete by: As directed    Use stockings (TED hose) for three weeks on both leg(s).  You may remove them at night for sleeping.   Weight bearing as tolerated   Complete by: As directed       Allergies as of 08/06/2023       Reactions   Ciprofloxacin    Muscle cramps   Nsaids    Bleeding ulcer   Wound Dressing Adhesive Rash        Medication List     STOP taking these medications    CALCIUM  + D PO   Curcumin 95 500 MG Caps Generic drug: Turmeric   Glucosamine Chond Complex/MSM Tabs   Omega 3 1000 MG Caps   oxyCODONE-acetaminophen 5-325 MG tablet Commonly known as: PERCOCET/ROXICET   TURMERIC PO       TAKE these medications    acetaminophen 500 MG tablet Commonly known as: TYLENOL Take 1,000 mg by mouth every 8 (eight) hours as needed for headache or moderate pain.   albuterol 108 (90 Base) MCG/ACT inhaler Commonly known as: VENTOLIN HFA Inhale 2 puffs into the lungs every 6 (six) hours as needed.   alendronate 70 MG tablet Commonly known as: FOSAMAX Take 70 mg by mouth once a week.   amoxicillin-clavulanate 875-125 MG tablet Commonly known as: AUGMENTIN Take 1 tablet by mouth 2 (two) times daily.   hydrOXYzine 25 MG tablet Commonly known as: ATARAX Take 25 mg by mouth 3 (three) times daily as needed for anxiety.   methocarbamol 500 MG tablet Commonly known as: ROBAXIN Take 1 tablet (500 mg total) by mouth every 6 (six) hours as needed for muscle spasms.   omeprazole 20 MG capsule Commonly known as: PRILOSEC Take 20 mg by mouth daily.   oxyCODONE 5 MG immediate release tablet Commonly known as: Oxy IR/ROXICODONE Take 5 mg by mouth every 8 (eight) hours as needed for severe pain.   polyethylene glycol powder 17 GM/SCOOP powder Commonly known as: GLYCOLAX/MIRALAX Take 1 Container by mouth as needed.   rivaroxaban 10 MG Tabs tablet Commonly known as: XARELTO Take 1 tablet (10 mg total) by mouth daily with breakfast for 20 days.   traMADol 50 MG tablet Commonly known as: ULTRAM Take 1 tablet (50 mg total) by mouth every 6 (six) hours.   zolpidem 10 MG tablet Commonly known as: AMBIEN Take 10 mg by mouth at bedtime.               Discharge Care Instructions  (From admission, onward)           Start     Ordered   08/06/23 0000  Weight bearing as tolerated  08/06/23 1635   08/06/23 0000  Change dressing       Comments:  You have an adhesive waterproof bandage over the incision. Leave this in place until your first follow-up appointment. Once you remove this you will not need to place another bandage.   08/06/23 1635            Follow-up Information     Ollen Gross, MD. Schedule an appointment as soon as possible for a visit in 2 week(s).   Specialty: Orthopedic Surgery Contact information: [address] [place] [address] [place] [address] [phone]                 Signed: Weston Brass, PA-C Orthopedic Surgery 08/08/2023, 11:59 AM

## 2023-08-09 LAB — TYPE AND SCREEN
ABO/RH(D): O POS
Antibody Screen: POSITIVE
Unit division: 0
Unit division: 0

## 2023-08-09 LAB — BPAM RBC
Blood Product Expiration Date: 202409062359
Blood Product Expiration Date: 202409062359
Unit Type and Rh: 5100
Unit Type and Rh: 5100
